# Patient Record
Sex: Male | Born: 1960 | Race: Black or African American | Hispanic: No | Marital: Married | Smoking: Never smoker
Health system: Southern US, Community
[De-identification: ages and names within clinical notes are randomized; demographics above are authoritative.]

## PROBLEM LIST (undated history)

## (undated) DIAGNOSIS — E119 Type 2 diabetes mellitus without complications: Secondary | ICD-10-CM

## (undated) DIAGNOSIS — R011 Cardiac murmur, unspecified: Secondary | ICD-10-CM

## (undated) DIAGNOSIS — I10 Essential (primary) hypertension: Secondary | ICD-10-CM

## (undated) DIAGNOSIS — N189 Chronic kidney disease, unspecified: Secondary | ICD-10-CM

## (undated) HISTORY — DX: Type 2 diabetes mellitus without complications: E11.9

## (undated) HISTORY — DX: Cardiac murmur, unspecified: R01.1

## (undated) HISTORY — DX: Essential (primary) hypertension: I10

## (undated) HISTORY — PX: NO PAST SURGERIES: SHX2092

## (undated) HISTORY — DX: Chronic kidney disease, unspecified: N18.9

---

## 2004-06-12 ENCOUNTER — Emergency Department (HOSPITAL_COMMUNITY): Admission: EM | Admit: 2004-06-12 | Discharge: 2004-06-12 | Payer: Self-pay | Admitting: Family Medicine

## 2006-03-01 IMAGING — CR DG HAND COMPLETE 3+V*R*
3 series · 3 of 3 positions shown · non-contrast
Comparison: none

CLINICAL DATA: multiple lacerations to right hand with glass
 RIGHT HAND-THREE VIEWS:
 There is no evidence of fracture or dislocation.  No other bone abnormality is seen.  There is no evidence of radiopaque foreign body.

[view not recorded (1 of 3)]
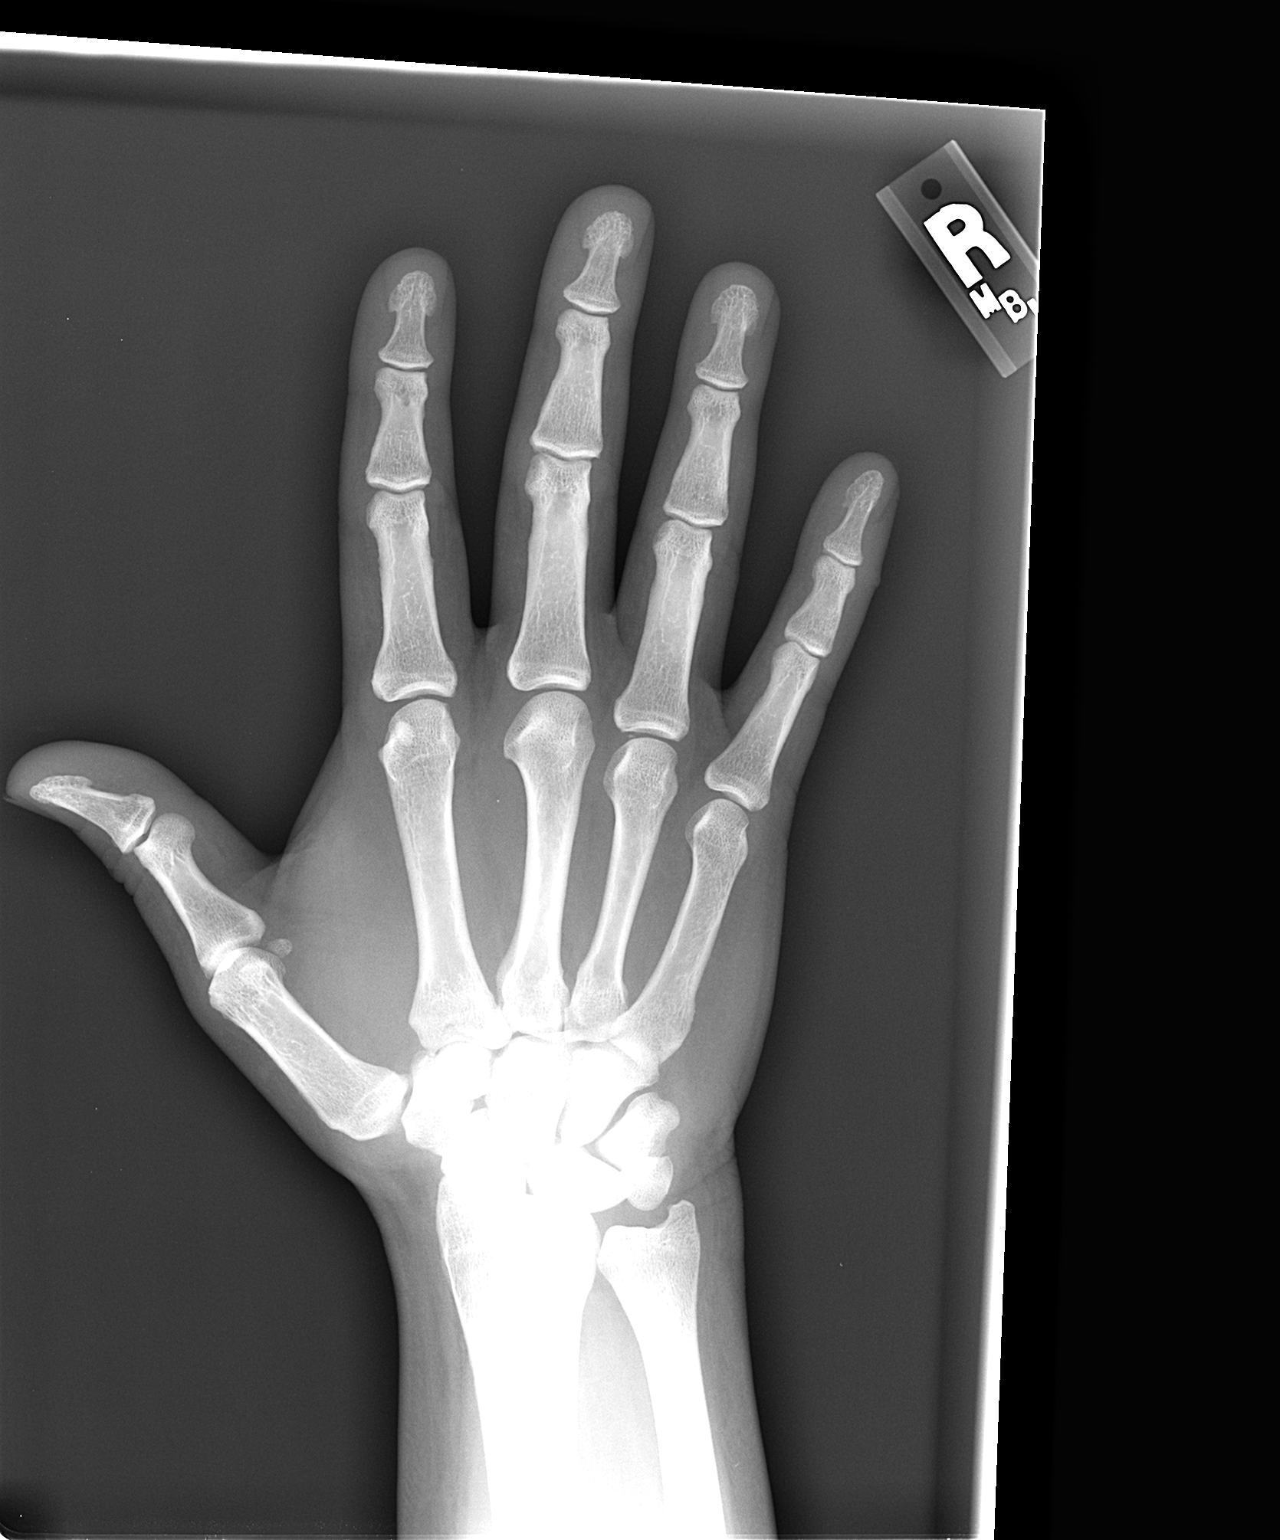

[view not recorded (2 of 3)]
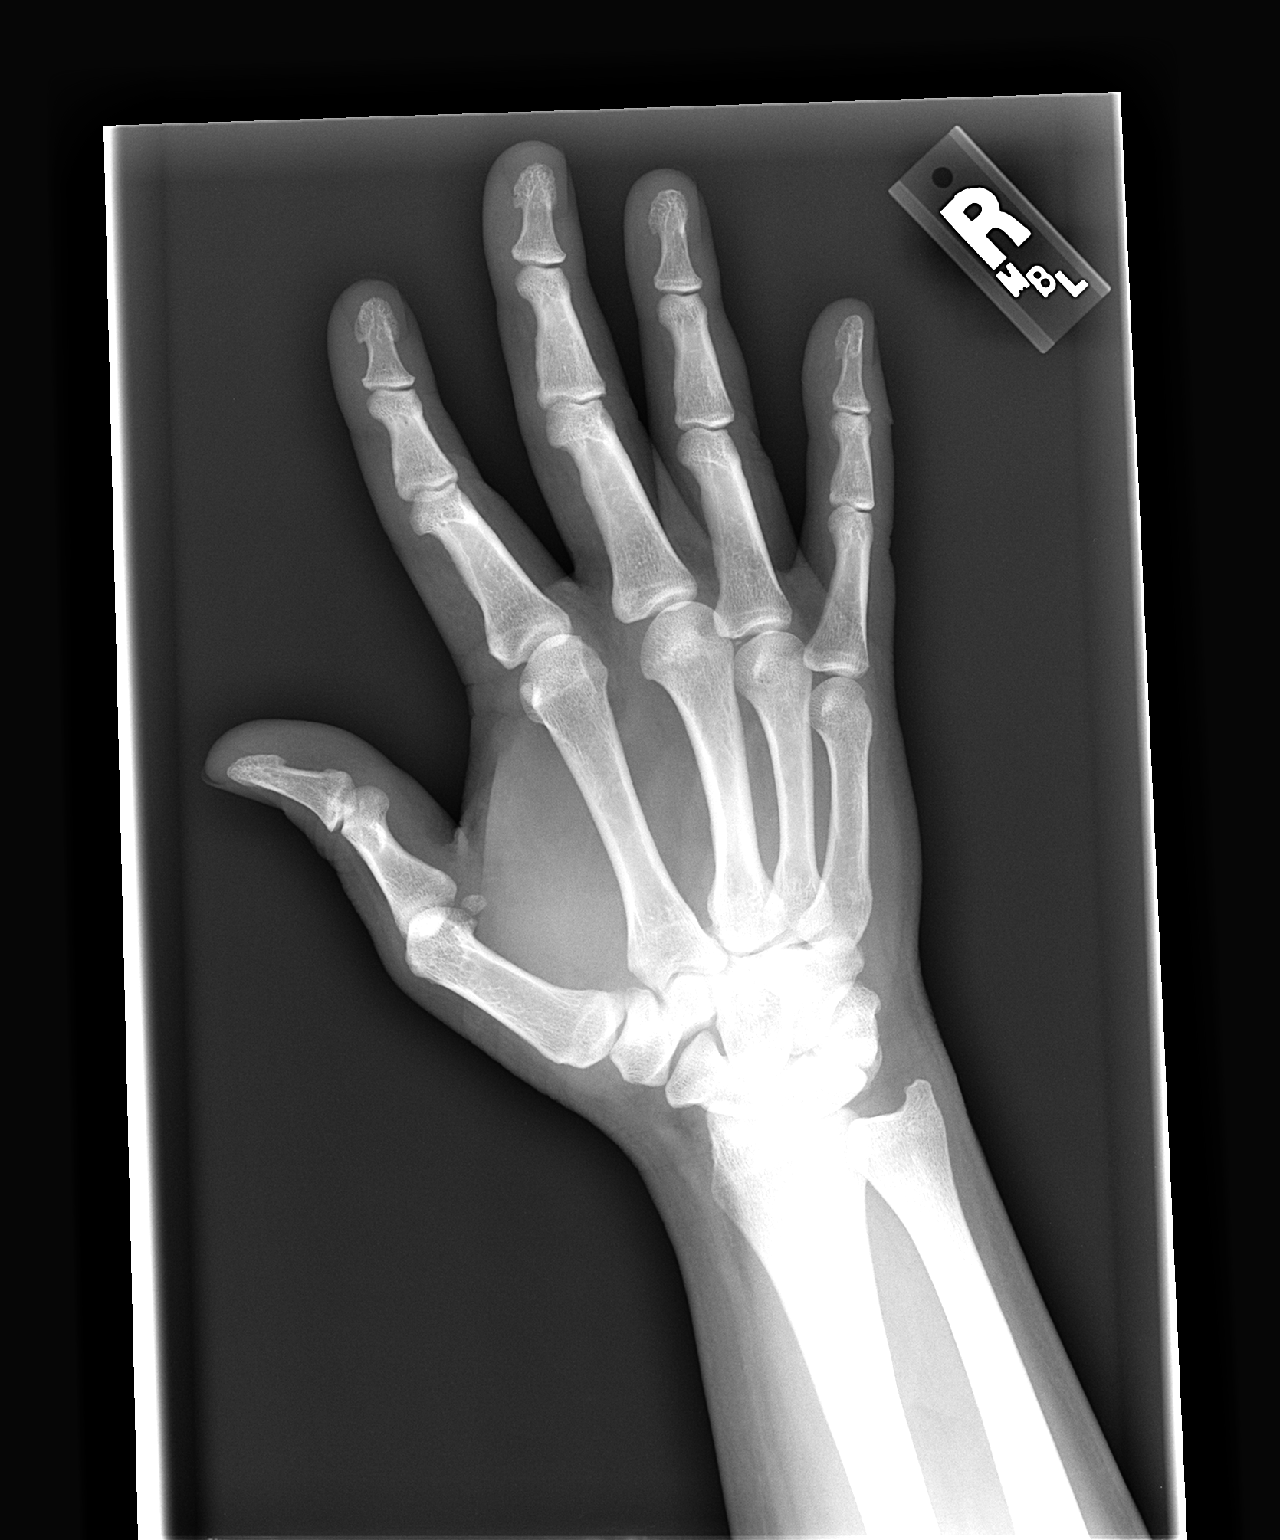

[view not recorded (3 of 3)]
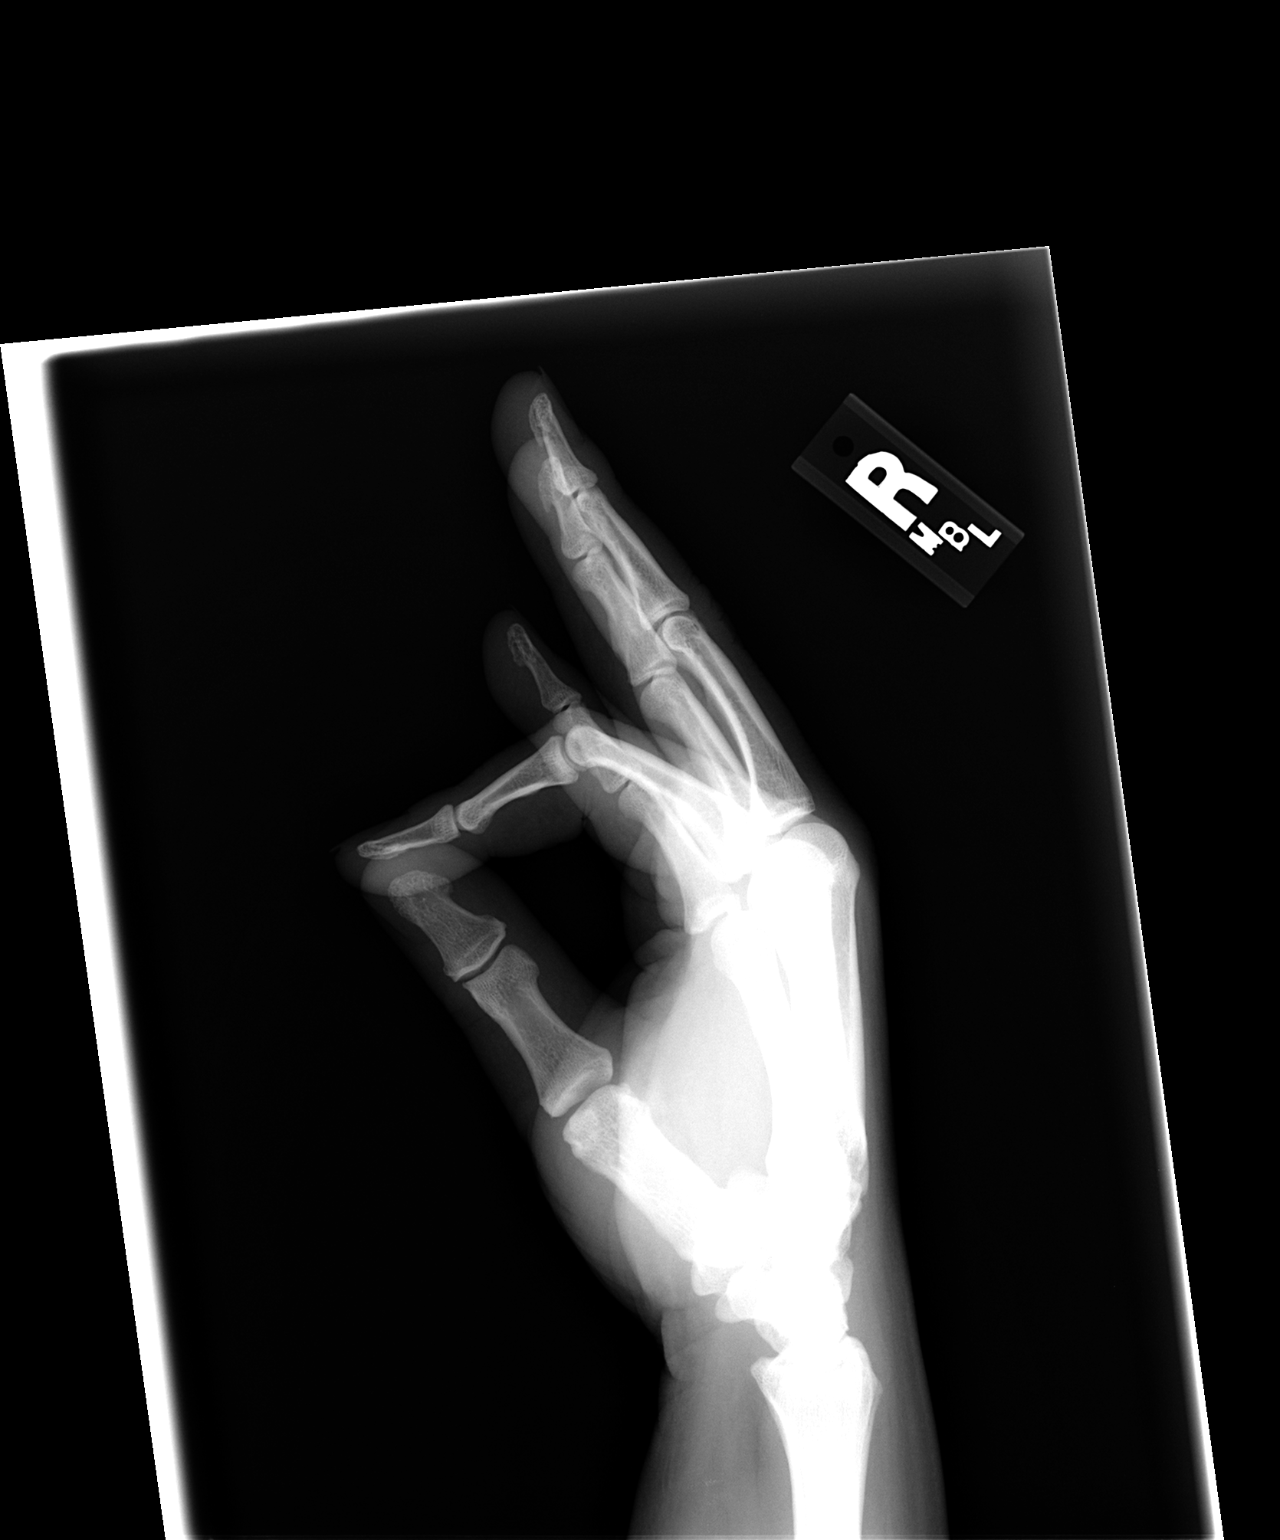

[3 of 3 positions shown; findings below may reference images not displayed]

IMPRESSION: Negative.

## 2014-12-03 ENCOUNTER — Ambulatory Visit (INDEPENDENT_AMBULATORY_CARE_PROVIDER_SITE_OTHER): Payer: BLUE CROSS/BLUE SHIELD | Admitting: Emergency Medicine

## 2014-12-03 VITALS — BP 118/70 | HR 89 | Temp 99.0°F | Resp 18 | Ht 70.0 in | Wt 180.0 lb

## 2014-12-03 DIAGNOSIS — H9203 Otalgia, bilateral: Secondary | ICD-10-CM

## 2014-12-03 DIAGNOSIS — R509 Fever, unspecified: Secondary | ICD-10-CM | POA: Diagnosis not present

## 2014-12-03 DIAGNOSIS — E119 Type 2 diabetes mellitus without complications: Secondary | ICD-10-CM | POA: Diagnosis not present

## 2014-12-03 LAB — POCT CBC
GRANULOCYTE PERCENT: 71.8 % (ref 37–80)
HCT, POC: 46.6 % (ref 43.5–53.7)
Hemoglobin: 15.7 g/dL (ref 14.1–18.1)
LYMPH, POC: 2.1 (ref 0.6–3.4)
MCH: 27.9 pg (ref 27–31.2)
MCHC: 33.7 g/dL (ref 31.8–35.4)
MCV: 82.9 fL (ref 80–97)
MID (CBC): 0.6 (ref 0–0.9)
MPV: 6.8 fL (ref 0–99.8)
PLATELET COUNT, POC: 280 10*3/uL (ref 142–424)
POC GRANULOCYTE: 7 — AB (ref 2–6.9)
POC LYMPH PERCENT: 21.5 %L (ref 10–50)
POC MID %: 6.7 % (ref 0–12)
RBC: 5.62 M/uL (ref 4.69–6.13)
RDW, POC: 13.4 %
WBC: 9.7 10*3/uL (ref 4.6–10.2)

## 2014-12-03 LAB — GLUCOSE, POCT (MANUAL RESULT ENTRY): POC Glucose: 113 mg/dl — AB (ref 70–99)

## 2014-12-03 LAB — POCT RAPID STREP A (OFFICE): Rapid Strep A Screen: NEGATIVE

## 2014-12-03 NOTE — Progress Notes (Addendum)
Subjective:    Patient ID: Danny Ferguson, male    DOB: November 30, 1960, 54 y.o.   MRN: [id number] This chart was scribed for Lesle Chris, MD by Littie Deeds, Medical Scribe. This patient was seen in room 9 and the patient's care was started at 11:36 AM.   HPI HPI Comments: Danny Ferguson is a 54 y.o. male with a history of DM who presents to the [facility] complaining of gradual onset sinus pressure that started a few days ago. Patient also reports having associated throbbing bilateral ear pain, mild cough, mild dizziness described as disorientation, and right sided facial pain. He also had a very small amount of nasal discharge in the shower this morning. He denies fever and problems with seasonal allergies.  Patient was diagnosed with diabetes about a year and a half ago. At that time, he weighed 249 lbs with a HgbA1c over 12. He also had some problems with urinary frequency at the time. Since then, he has been able to control his diabetes with metformin and diet changes. He notes that his last HgbA1c was 5.5. He has also lost a significant amount of weight; he is 180 lbs today. Patient had been noted to have some diminishing kidney function which has been improving slightly. This is being closely monitored. FMHx includes DM and hypertension in his father and grandfather.  Review of Systems  Constitutional: Negative for fever.  HENT: Positive for ear pain, rhinorrhea and sinus pressure. Negative for sore throat.   Respiratory: Positive for cough.   Allergic/Immunologic: Negative for environmental allergies.  Neurological: Positive for dizziness.       Objective:   Physical Exam CONSTITUTIONAL: Well developed/well nourished HEAD: Normocephalic/atraumatic EYES: EOM/PERRL ENMT: Mucous membranes moist. Turbinates blue and swollen NECK: supple no meningeal signs SPINE: entire spine nontender CV: S1/S2 noted, no murmurs/rubs/gallops noted LUNGS: Lungs are clear to  auscultation bilaterally, no apparent distress ABDOMEN: soft, nontender, no rebound or guarding GU: no cva tenderness NEURO: Pt is awake/alert, moves all extremitiesx4 EXTREMITIES: pulses normal, full ROM SKIN: warm, color normal PSYCH: no abnormalities of mood noted Results for orders placed or performed in visit on 12/03/14  POCT CBC  Result Value Ref Range   WBC 9.7 4.6 - 10.2 K/uL   Lymph, poc 2.1 0.6 - 3.4   POC LYMPH PERCENT 21.5 10 - 50 %L   MID (cbc) 0.6 0 - 0.9   POC MID % 6.7 0 - 12 %M   POC Granulocyte 7.0 (A) 2 - 6.9   Granulocyte percent 71.8 37 - 80 %G   RBC 5.62 4.69 - 6.13 M/uL   Hemoglobin 15.7 14.1 - 18.1 g/dL   HCT, POC 78.2 95.6 - 53.7 %   MCV 82.9 80 - 97 fL   MCH, POC 27.9 27 - 31.2 pg   MCHC 33.7 31.8 - 35.4 g/dL   RDW, POC 21.3 %   Platelet Count, POC 280 142 - 424 K/uL   MPV 6.8 0 - 99.8 fL  POCT glucose (manual entry)  Result Value Ref Range   POC Glucose 113 (A) 70 - 99 mg/dl  POCT rapid strep A  Result Value Ref Range   Rapid Strep A Screen Negative Negative        Assessment & Plan:   advise patient to take Zyrtec 10 one a day and use Flonase nasal spray. to call if he developed a purulent nasal drainage.I personally performed the services described in this documentation, which was scribed  in my presence. The recorded information has been reviewed and is accurate.  Earl LitesSteve Khaleel Beckom, MD

## 2014-12-03 NOTE — Patient Instructions (Signed)
Take Zyrtec 10 mg 1 a day. Use Flonase 2 puffs each nares once a day.

## 2014-12-15 ENCOUNTER — Telehealth: Payer: Self-pay

## 2014-12-15 NOTE — Telephone Encounter (Signed)
BMP was not done at [facility]. Per Dr. Cleta Alberts, call pt and just tell him it was not sent and advise him at some time he ought to have a good physical exam he can schedule at 104

## 2014-12-16 NOTE — Telephone Encounter (Signed)
Spoke to pt and informed him he should set up to have a good physical exam at 104.  Informed him the BMP was not sent out.

## 2015-07-20 LAB — PSA: PSA: 2.4

## 2016-07-18 LAB — HEMOGLOBIN A1C: HEMOGLOBIN A1C: 6.4

## 2016-07-18 LAB — BASIC METABOLIC PANEL
BUN: 25 — AB (ref 4–21)
Creatinine: 1.1 (ref 0.6–1.3)
Glucose: 126
Potassium: 4.5 (ref 3.4–5.3)
Sodium: 142 (ref 137–147)

## 2016-07-18 LAB — HEPATIC FUNCTION PANEL
ALK PHOS: 78 (ref 25–125)
ALT: 14 (ref 10–40)
AST: 20 (ref 14–40)
BILIRUBIN, TOTAL: 0.5

## 2016-07-18 LAB — CBC AND DIFFERENTIAL
HEMATOCRIT: 44 (ref 41–53)
Hemoglobin: 14.5 (ref 13.5–17.5)
PLATELETS: 414 — AB (ref 150–399)
WBC: 9.4

## 2016-07-19 LAB — PSA: PSA: 4.1

## 2017-04-25 ENCOUNTER — Encounter: Payer: Self-pay | Admitting: Physician Assistant

## 2017-04-25 ENCOUNTER — Other Ambulatory Visit: Payer: Self-pay

## 2017-04-25 ENCOUNTER — Ambulatory Visit: Payer: Managed Care, Other (non HMO) | Admitting: Physician Assistant

## 2017-04-25 VITALS — BP 138/88 | HR 90 | Temp 98.0°F | Resp 16 | Ht 70.87 in | Wt 197.0 lb

## 2017-04-25 DIAGNOSIS — Z125 Encounter for screening for malignant neoplasm of prostate: Secondary | ICD-10-CM | POA: Diagnosis not present

## 2017-04-25 DIAGNOSIS — M545 Low back pain, unspecified: Secondary | ICD-10-CM

## 2017-04-25 DIAGNOSIS — N189 Chronic kidney disease, unspecified: Secondary | ICD-10-CM | POA: Diagnosis not present

## 2017-04-25 DIAGNOSIS — I10 Essential (primary) hypertension: Secondary | ICD-10-CM | POA: Diagnosis not present

## 2017-04-25 DIAGNOSIS — Z23 Encounter for immunization: Secondary | ICD-10-CM

## 2017-04-25 DIAGNOSIS — I1A0 Resistant hypertension: Secondary | ICD-10-CM | POA: Insufficient documentation

## 2017-04-25 DIAGNOSIS — E119 Type 2 diabetes mellitus without complications: Secondary | ICD-10-CM | POA: Diagnosis not present

## 2017-04-25 LAB — POCT URINALYSIS DIP (MANUAL ENTRY)
BILIRUBIN UA: NEGATIVE
BILIRUBIN UA: NEGATIVE mg/dL
GLUCOSE UA: NEGATIVE mg/dL
Leukocytes, UA: NEGATIVE
Nitrite, UA: NEGATIVE
Protein Ur, POC: NEGATIVE mg/dL
SPEC GRAV UA: 1.015 (ref 1.010–1.025)
Urobilinogen, UA: 0.2 E.U./dL
pH, UA: 5.5 (ref 5.0–8.0)

## 2017-04-25 LAB — GLUCOSE, POCT (MANUAL RESULT ENTRY): POC Glucose: 149 mg/dl — AB (ref 70–99)

## 2017-04-25 MED ORDER — CYCLOBENZAPRINE HCL 10 MG PO TABS: 10.0000 mg | ORAL_TABLET | Freq: Three times a day (TID) | ORAL | 0 refills | Status: DC | PRN

## 2017-04-25 NOTE — Assessment & Plan Note (Signed)
Update labs today 

## 2017-04-25 NOTE — Assessment & Plan Note (Signed)
Last labs 12 months ago. Update today.

## 2017-04-25 NOTE — Assessment & Plan Note (Signed)
Borderline control today. May be elevated due to back pain. COntinue current treatment.

## 2017-04-25 NOTE — Progress Notes (Signed)
Patient ID: Danny FallenRobert Groner, male     DOB: 11/28/1960, 56 y.o.    MRN: [id number]  PCP: Patient, No Pcp Per, previous provider retired and practice has not found replacement.  Chief Complaint  Patient presents with  . Back Pain    lower back     Subjective:   This patient is new to me and presents for evaluation of low back pain.  1 week of LBP after getting items out of the attic. Had one episode of pain radiating into the LEFT groin. No pain radiating down the legs. No weakness, numbness, tingling in the legs. No saddle anesthesia. Has been on each side, mostly in the center. Difficult to sleep due to pain. Stiff when he gets up after sitting for a while or first thing in the morning. Avoids ibuprofen due to CKD. Acetaminophen 1000 mg Q8 hours, which helps some. Sitting still, "I'm ok."  Last labs done with previous provider about 12 months ago. Currently unemployed. Left is job due to undesirable work conditions. Plans to start a new job search after the first of the year. Has been helping is father, who lives in [place], and his son, recently diagnosed with bipolar disorder.  Review of Systems  Constitutional: Negative for chills and fever.  Respiratory: Negative for cough and shortness of breath.   Cardiovascular: Negative for chest pain, palpitations and leg swelling.  Gastrointestinal: Negative for diarrhea, nausea and vomiting.  Endocrine: Negative for polydipsia.  Genitourinary: Negative for dysuria, frequency and urgency.  Musculoskeletal: Negative for myalgias.  Skin: Negative for rash.  Neurological: Negative for dizziness and headaches.     Prior to Admission medications   Medication Sig Start Date End Date Taking? Authorizing Provider  lisinopril-hydrochlorothiazide (PRINZIDE,ZESTORETIC) 20-12.5 MG per tablet Take 1 tablet by mouth 2 (two) times daily.   Yes [provider]  metFORMIN (GLUCOPHAGE) 1000 MG tablet Take 1,000 mg by mouth 2  (two) times daily with a meal.   Yes [provider]     Allergies  Allergen Reactions  . Penicillins Nausea And Vomiting     Patient Active Problem List   Diagnosis Date Noted  . Diabetes mellitus type 2, uncomplicated (HCC) 04/25/2017  . Benign essential HTN 04/25/2017     Family History  Problem Relation Age of Onset  . Diabetes Mother   . Hypertension Father   . Diabetes Maternal Grandfather   . Cancer Maternal Grandfather   . Hyperlipidemia Maternal Grandfather   . Hypertension Maternal Grandfather   . Mental illness Son [id number]       Bipolar  . Cancer Sister        breast cancer     Social History   Socioeconomic History  . Marital status: Married    Spouse name: Anitra  . Number of children: 1  . Years of education: Not on file  . Highest education level: Master's degree (e.g., MA, MS, MEng, MEd, MSW, MBA)  Social Needs  . Financial resource strain: Not on file  . Food insecurity - worry: Not on file  . Food insecurity - inability: Not on file  . Transportation needs - medical: Not on file  . Transportation needs - non-medical: Not on file  Occupational History  . Occupation: unemployed  Tobacco Use  . Smoking status: Never Smoker  . Smokeless tobacco: Never Used  Substance and Sexual Activity  . Alcohol use: No    Alcohol/week: 0.0 oz  . Drug use: No  .  Sexual activity: Not on file  Other Topics Concern  . Not on file  Social History Narrative   Lives with his wife, his son who was diagnosed with bipolar disorder.   His father lives in [place].   Sisters: [place], [employer], [place], [place]         Objective:  Physical Exam  Constitutional: He is oriented to person, place, and time. He appears well-developed and well-nourished. He is active and cooperative. No distress.  BP 138/88   Pulse 90   Temp 98 F (36.7 C) (Oral)   Resp 16   Ht 5' 10.87" (1.8 m)   Wt 197 lb (89.4 kg)   SpO2 97%   BMI 27.58 kg/m   HENT:  Head: Normocephalic and  atraumatic.  Right Ear: Hearing normal.  Left Ear: Hearing normal.  Eyes: Conjunctivae are normal. No scleral icterus.  Neck: Normal range of motion. Neck supple. No thyromegaly present.  Cardiovascular: Normal rate, regular rhythm and normal heart sounds.  Pulses:      Radial pulses are 2+ on the right side, and 2+ on the left side.  Pulmonary/Chest: Effort normal and breath sounds normal.  Musculoskeletal:       Cervical back: Normal.       Thoracic back: Normal.       Lumbar back: He exhibits tenderness, pain and spasm. He exhibits normal range of motion, no bony tenderness, no swelling, no edema, no deformity, no laceration and normal pulse.  Lymphadenopathy:       Head (right side): No tonsillar, no preauricular, no posterior auricular and no occipital adenopathy present.       Head (left side): No tonsillar, no preauricular, no posterior auricular and no occipital adenopathy present.    He has no cervical adenopathy.       Right: No supraclavicular adenopathy present.       Left: No supraclavicular adenopathy present.  Neurological: He is alert and oriented to person, place, and time. He has normal strength. No sensory deficit.  Reflex Scores:      Patellar reflexes are 2+ on the right side and 2+ on the left side.      Achilles reflexes are 2+ on the right side and 2+ on the left side. Skin: Skin is warm, dry and intact. No rash noted. No cyanosis or erythema. Nails show no clubbing.  Psychiatric: He has a normal mood and affect. His speech is normal and behavior is normal.   Results for orders placed or performed in visit on 04/25/17  POCT urinalysis dipstick  Result Value Ref Range   Color, UA yellow yellow   Clarity, UA clear clear   Glucose, UA negative negative mg/dL   Bilirubin, UA negative negative   Ketones, POC UA negative negative mg/dL   Spec Grav, UA 1.610 9.604 - 1.025   Blood, UA trace-lysed (A) negative   pH, UA 5.5 5.0 - 8.0   Protein Ur, POC negative  negative mg/dL   Urobilinogen, UA 0.2 0.2 or 1.0 E.U./dL   Nitrite, UA Negative Negative   Leukocytes, UA Negative Negative  POCT glucose (manual entry)  Result Value Ref Range   POC Glucose 149 (A) 70 - 99 mg/dl        Assessment & Plan:   Problem List Items Addressed This Visit    Diabetes mellitus type 2, uncomplicated (HCC)    Last labs 12 months ago. Update today.      Relevant Orders   Comprehensive metabolic panel  Hemoglobin A1c   Lipid panel   Microalbumin / creatinine urine ratio   POCT glucose (manual entry) (Completed)   Benign essential HTN    Borderline control today. May be elevated due to back pain. COntinue current treatment.      Relevant Orders   POCT urinalysis dipstick (Completed)   CBC with Differential/Platelet   Comprehensive metabolic panel   TSH   CKD (chronic kidney disease)    Update labs today.      Relevant Orders   Comprehensive metabolic panel    Other Visit Diagnoses    Acute midline low back pain without sciatica    -  Primary   Musculoskeletal strain. COntinue acetaminophen. Add heat application and cyclobenzaprine. If no improvement, plan radiographs.   Relevant Medications   cyclobenzaprine (FLEXERIL) 10 MG tablet   Need for influenza vaccination       Relevant Orders   Flu Vaccine QUAD 36+ mos IM (Completed)   Screening for prostate cancer       reports levels consistently about 4. Has need urology previously.   Relevant Orders   PSA       Return in about 3 months (around 07/24/2017) for re-evaluation of diabetes, etc.   Fernande Brashelle S. Sheamus Hasting, PA-C [facility]

## 2017-04-25 NOTE — Patient Instructions (Signed)
     IF you received an x-ray today, you will receive an invoice from [facility]. Please contact [facility] at [phone] with questions or concerns regarding your invoice.   IF you received labwork today, you will receive an invoice from [facility]. Please contact [facility] at [phone] with questions or concerns regarding your invoice.   Our billing staff will not be able to assist you with questions regarding bills from these companies.  You will be contacted with the lab results as soon as they are available. The fastest way to get your results is to activate your My Chart account. Instructions are located on the last page of this paperwork. If you have not heard from us regarding the results in 2 weeks, please contact this office.     

## 2017-04-26 LAB — COMPREHENSIVE METABOLIC PANEL
ALT: 28 IU/L (ref 0–44)
AST: 19 IU/L (ref 0–40)
Albumin/Globulin Ratio: 1.8 (ref 1.2–2.2)
Albumin: 4.6 g/dL (ref 3.5–5.5)
Alkaline Phosphatase: 80 IU/L (ref 39–117)
BUN/Creatinine Ratio: 15 (ref 9–20)
BUN: 19 mg/dL (ref 6–24)
Bilirubin Total: 0.9 mg/dL (ref 0.0–1.2)
CALCIUM: 9.7 mg/dL (ref 8.7–10.2)
CO2: 24 mmol/L (ref 20–29)
CREATININE: 1.23 mg/dL (ref 0.76–1.27)
Chloride: 98 mmol/L (ref 96–106)
GFR calc Af Amer: 75 mL/min/{1.73_m2} (ref >59)
GFR, EST NON AFRICAN AMERICAN: 65 mL/min/{1.73_m2} (ref >59)
GLUCOSE: 126 mg/dL — AB (ref 65–99)
Globulin, Total: 2.5 g/dL (ref 1.5–4.5)
Potassium: 4.1 mmol/L (ref 3.5–5.2)
Sodium: 139 mmol/L (ref 134–144)
Total Protein: 7.1 g/dL (ref 6.0–8.5)

## 2017-04-26 LAB — CBC WITH DIFFERENTIAL/PLATELET
BASOS: 0 %
Basophils Absolute: 0 10*3/uL (ref 0.0–0.2)
EOS (ABSOLUTE): 0.1 10*3/uL (ref 0.0–0.4)
EOS: 1 %
HEMATOCRIT: 45 % (ref 37.5–51.0)
Hemoglobin: 15 g/dL (ref 13.0–17.7)
IMMATURE GRANULOCYTES: 0 %
Immature Grans (Abs): 0 10*3/uL (ref 0.0–0.1)
LYMPHS ABS: 1.8 10*3/uL (ref 0.7–3.1)
Lymphs: 22 %
MCH: 28.9 pg (ref 26.6–33.0)
MCHC: 33.3 g/dL (ref 31.5–35.7)
MCV: 87 fL (ref 79–97)
MONOS ABS: 0.7 10*3/uL (ref 0.1–0.9)
Monocytes: 8 %
NEUTROS ABS: 5.8 10*3/uL (ref 1.4–7.0)
Neutrophils: 69 %
PLATELETS: 312 10*3/uL (ref 150–379)
RBC: 5.19 x10E6/uL (ref 4.14–5.80)
RDW: 14.1 % (ref 12.3–15.4)
WBC: 8.4 10*3/uL (ref 3.4–10.8)

## 2017-04-26 LAB — LIPID PANEL
CHOLESTEROL TOTAL: 167 mg/dL (ref 100–199)
Chol/HDL Ratio: 2.9 ratio (ref 0.0–5.0)
HDL: 57 mg/dL (ref >39)
LDL Calculated: 98 mg/dL (ref 0–99)
Triglycerides: 61 mg/dL (ref 0–149)
VLDL CHOLESTEROL CAL: 12 mg/dL (ref 5–40)

## 2017-04-26 LAB — HEMOGLOBIN A1C
ESTIMATED AVERAGE GLUCOSE: 148 mg/dL
Hgb A1c MFr Bld: 6.8 % — ABNORMAL HIGH (ref 4.8–5.6)

## 2017-04-26 LAB — MICROALBUMIN / CREATININE URINE RATIO
Creatinine, Urine: 105 mg/dL
Microalbumin, Urine: <3 ug/mL

## 2017-04-26 LAB — TSH: TSH: 1.59 u[IU]/mL (ref 0.450–4.500)

## 2017-04-26 LAB — PSA: PROSTATE SPECIFIC AG, SERUM: 4.4 ng/mL — AB (ref 0.0–4.0)

## 2017-05-15 ENCOUNTER — Encounter: Payer: Self-pay | Admitting: Physician Assistant

## 2017-05-15 DIAGNOSIS — R972 Elevated prostate specific antigen [PSA]: Secondary | ICD-10-CM | POA: Insufficient documentation

## 2017-05-31 ENCOUNTER — Ambulatory Visit: Payer: Managed Care, Other (non HMO) | Admitting: Family Medicine

## 2017-05-31 ENCOUNTER — Encounter: Payer: Self-pay | Admitting: Family Medicine

## 2017-05-31 VITALS — BP 138/86 | HR 81 | Temp 98.5°F | Ht 69.5 in | Wt 186.2 lb

## 2017-05-31 DIAGNOSIS — N189 Chronic kidney disease, unspecified: Secondary | ICD-10-CM | POA: Diagnosis not present

## 2017-05-31 DIAGNOSIS — R972 Elevated prostate specific antigen [PSA]: Secondary | ICD-10-CM

## 2017-05-31 DIAGNOSIS — Z23 Encounter for immunization: Secondary | ICD-10-CM

## 2017-05-31 DIAGNOSIS — E119 Type 2 diabetes mellitus without complications: Secondary | ICD-10-CM | POA: Diagnosis not present

## 2017-05-31 DIAGNOSIS — M545 Low back pain, unspecified: Secondary | ICD-10-CM | POA: Insufficient documentation

## 2017-05-31 DIAGNOSIS — I10 Essential (primary) hypertension: Secondary | ICD-10-CM | POA: Diagnosis not present

## 2017-05-31 NOTE — Patient Instructions (Signed)
Great to meet you.  Please come see me in 3 months.

## 2017-05-31 NOTE — Assessment & Plan Note (Signed)
Well controlled. On ACEI. LDL at goal for a diabetic. On ASA 81 mg daily. Pneumovax given today.

## 2017-05-31 NOTE — Assessment & Plan Note (Signed)
Well controlled.  No changes made. 

## 2017-05-31 NOTE — Assessment & Plan Note (Signed)
Lumbar strain. Improving No red flag symptoms. Call or return to clinic prn if these symptoms worsen or fail to improve as anticipated. The patient indicates understanding of these issues and agrees with the plan.

## 2017-05-31 NOTE — Assessment & Plan Note (Signed)
Awaiting records to see PSA trend given family history of prostate CA. He does not want referral back to see urology yet at this time.

## 2017-05-31 NOTE — Progress Notes (Signed)
Subjective:   Patient ID: Danny Ferguson, male    DOB: 1960/08/21, 57 y.o.   MRN: [id number]  Danny Ferguson is a pleasant 57 y.o. year old male who presents to clinic today with New Patient (Initial Visit) (Patient is here today as a new patient.  Labs completed in December have been printed for review.  ); Diabetes (Would like to discuss DM. Was almost 300 lbs when Dx and went to kidney doc.  He lost a lot of weight and has improved.  ); Back Pain (Patient went to an UC for lower back pain on 12.04.2018.  The pain started around Thanksgiving.  He was unable to get in to his previous office as doc was only there once weekly in [place].  He was Rx'ed Cyclobenzaprine but states that he is still having issues with it but it has decreased and has a "catch" in it.); and Other (He would like to discuss his PSA numbers as well.)  on 05/31/2017  HPI:  Was seen at [facility] for LBP on 04/25/17.  Took as needed flexeril.  It does feel better today.  Denies LE radiculopathy.  DM- Diagnosed in 2014. currently taking Metformin 1000 mg twice daily.  Labs done last month at previous PCP. Checks FSBS every other week.  Denies any episodes or symptoms of hypoglycemia. Lab Results  Component Value Date   HGBA1C 6.8 (H) 04/25/2017   Family h/o prostate CA- dad and grandfather. PSA 4.4 last month.  Was previously followed by urology but has not been seen recently.  He is on an ACEI- pinzide 20-12.5 mg twice daily.  Lab Results  Component Value Date   CREATININE 1.23 04/25/2017   Lab Results  Component Value Date   CHOL 167 04/25/2017   HDL 57 04/25/2017   LDLCALC 98 04/25/2017   TRIG 61 04/25/2017   CHOLHDL 2.9 04/25/2017   Lab Results  Component Value Date   ALT 28 04/25/2017   AST 19 04/25/2017   ALKPHOS 80 04/25/2017   BILITOT 0.9 04/25/2017   Lab Results  Component Value Date   NA 139 04/25/2017   K 4.1 04/25/2017   CL 98 04/25/2017   CO2 24 04/25/2017   No results found for: PSA    Current  Outpatient Medications on File Prior to Visit  Medication Sig Dispense Refill  . aspirin EC 81 MG tablet Take 81 mg by mouth daily.    . cyclobenzaprine (FLEXERIL) 10 MG tablet Take 1 tablet (10 mg total) by mouth 3 (three) times daily as needed for muscle spasms. 30 tablet 0  . lisinopril-hydrochlorothiazide (PRINZIDE,ZESTORETIC) 20-12.5 MG per tablet Take 1 tablet by mouth 2 (two) times daily.    . metFORMIN (GLUCOPHAGE) 1000 MG tablet Take 1,000 mg by mouth 2 (two) times daily with a meal.     No current facility-administered medications on file prior to visit.     Allergies  Allergen Reactions  . Penicillins Nausea And Vomiting    Past Medical History:  Diagnosis Date  . Chronic kidney disease   . Diabetes mellitus without complication (HCC)   . Heart murmur   . Hypertension     Past Surgical History:  Procedure Laterality Date  . NO PAST SURGERIES      Family History  Problem Relation Age of Onset  . Diabetes Mother   . Hypertension Mother   . Hypertension Father   . Cancer Father        Prostate  . Hyperlipidemia Father   .  Cancer Maternal Grandmother        Breast  . Diabetes Maternal Grandmother   . Hypertension Maternal Grandmother   . Factor V Leiden deficiency Maternal Grandmother   . Alzheimer's disease Maternal Grandfather   . Mental illness Son 51       Bipolar  . Depression Son   . Cancer Sister        breast cancer  . Alzheimer's disease Paternal Grandmother   . Hypertension Paternal Grandmother   . Cancer Paternal Grandfather        Bone & Prostate  . Diabetes Paternal Grandfather   . Hyperlipidemia Paternal Grandfather   . Hypertension Paternal Grandfather   . Glaucoma Paternal Grandfather     Social History   Socioeconomic History  . Marital status: Married    Spouse name: Anitra  . Number of children: 1  . Years of education: Not on file  . Highest education level: Master's degree (e.g., MA, MS, MEng, MEd, MSW, MBA)  Social Needs    . Financial resource strain: Not on file  . Food insecurity - worry: Not on file  . Food insecurity - inability: Not on file  . Transportation needs - medical: Not on file  . Transportation needs - non-medical: Not on file  Occupational History  . Occupation: unemployed  Tobacco Use  . Smoking status: Never Smoker  . Smokeless tobacco: Never Used  Substance and Sexual Activity  . Alcohol use: No    Alcohol/week: 0.0 oz  . Drug use: No  . Sexual activity: Not on file  Other Topics Concern  . Not on file  Social History Narrative   Lives with his wife, his son who was diagnosed with bipolar disorder.   His father lives in [place].   Sisters: [place], [place], [place], [place]   The PMH, PSH, Social History, Family History, Medications, and allergies have been reviewed in [facility], and have been updated if relevant.   Review of Systems  Constitutional: Negative.   HENT: Negative.   Eyes: Negative.   Respiratory: Negative.   Cardiovascular: Negative.   Gastrointestinal: Negative.   Endocrine: Negative.   Genitourinary: Negative.   Musculoskeletal: Positive for back pain.  Allergic/Immunologic: Negative.   Neurological: Negative.   Hematological: Negative.   Psychiatric/Behavioral: Negative.   All other systems reviewed and are negative.      Objective:    BP 138/86 (BP Location: Left Arm, Patient Position: Sitting, Cuff Size: Normal)   Pulse 81   Temp 98.5 F (36.9 C) (Oral)   Ht 5' 9.5" (1.765 m)   Wt 186 lb 3.2 oz (84.5 kg)   SpO2 97%   BMI 27.10 kg/m    Physical Exam  General:  pleasant male in no acute distress Eyes:  PERRL Ears:  External ear exam shows no significant lesions or deformities.  TMs normal bilaterally Hearing is grossly normal bilaterally. Nose:  External nasal examination shows no deformity or inflammation. Nasal mucosa are pink and moist without lesions or exudates. Mouth:  Oral mucosa and oropharynx without lesions or exudates.  Teeth in good  repair. Neck:  no carotid bruit or thyromegaly no cervical or supraclavicular lymphadenopathy  Lungs:  Normal respiratory effort, chest expands symmetrically. Lungs are clear to auscultation, no crackles or wheezes. Heart:  Normal rate and regular rhythm. S1 and S2 normal without gallop, murmur, click, rub or other extra sounds. Abdomen:  Bowel sounds positive,abdomen soft and non-tender without masses, organomegaly or hernias noted. Pulses:  R and L  posterior tibial pulses are full and equal bilaterally  Extremities:  no edema  Psych:  Good eye contact, not anxious or depressed appearing      Assessment & Plan:   Benign essential HTN  Chronic kidney disease, unspecified CKD stage  Type 2 diabetes mellitus without complication, without long-term current use of insulin (HCC)  Elevated PSA, less than 10 ng/ml No Follow-up on file.

## 2017-07-25 ENCOUNTER — Ambulatory Visit: Payer: Managed Care, Other (non HMO) | Admitting: Physician Assistant

## 2017-08-29 ENCOUNTER — Telehealth: Payer: Self-pay

## 2017-08-29 ENCOUNTER — Ambulatory Visit: Payer: Managed Care, Other (non HMO) | Admitting: Family Medicine

## 2017-08-29 ENCOUNTER — Encounter: Payer: Self-pay | Admitting: Family Medicine

## 2017-08-29 VITALS — BP 144/96 | HR 68 | Temp 98.7°F | Ht 70.0 in | Wt 196.0 lb

## 2017-08-29 DIAGNOSIS — M545 Low back pain, unspecified: Secondary | ICD-10-CM

## 2017-08-29 DIAGNOSIS — E119 Type 2 diabetes mellitus without complications: Secondary | ICD-10-CM | POA: Diagnosis not present

## 2017-08-29 DIAGNOSIS — N189 Chronic kidney disease, unspecified: Secondary | ICD-10-CM | POA: Diagnosis not present

## 2017-08-29 DIAGNOSIS — I10 Essential (primary) hypertension: Secondary | ICD-10-CM

## 2017-08-29 DIAGNOSIS — R972 Elevated prostate specific antigen [PSA]: Secondary | ICD-10-CM | POA: Diagnosis not present

## 2017-08-29 LAB — POCT GLYCOSYLATED HEMOGLOBIN (HGB A1C): HEMOGLOBIN A1C: 6.8

## 2017-08-29 MED ORDER — CYCLOBENZAPRINE HCL 10 MG PO TABS: 10.0000 mg | ORAL_TABLET | Freq: Three times a day (TID) | ORAL | 0 refills | Status: DC | PRN

## 2017-08-29 MED ORDER — LISINOPRIL-HYDROCHLOROTHIAZIDE 20-12.5 MG PO TABS: 1.0000 | ORAL_TABLET | Freq: Two times a day (BID) | ORAL | 1 refills | Status: DC

## 2017-08-29 MED ORDER — METFORMIN HCL 1000 MG PO TABS: 1000.0000 mg | ORAL_TABLET | Freq: Two times a day (BID) | ORAL | 1 refills | Status: DC

## 2017-08-29 NOTE — Patient Instructions (Signed)
Great to see you. Please come see me in 6 months.   

## 2017-08-29 NOTE — Assessment & Plan Note (Signed)
Just took his rxs before coming here. Has been at goal for a diabetic. No changes made.

## 2017-08-29 NOTE — Telephone Encounter (Signed)
In January we faxed a signed records request/we have yet to receive the records/I called and was transferred to a nurse's line/I LMOVM stating that we have an urgent need for past PSA levels to be faxed to our office ASAP as the pt is in the office and this was the point of the visit today/I also refaxed the records request with a coversheet explaining the urgency/thx dmf

## 2017-08-29 NOTE — Progress Notes (Signed)
Subjective:   Patient ID: Danny Ferguson, male    DOB: August 06, 1960, 57 y.o.   MRN: [id number]  Danny Ferguson is a pleasant 57 y.o. year old male who presents to clinic today with Follow-up (Patient is here today to F/U with DM, HTN, and PSA.  on 12.4.18 A1C was 6.8 he has been compliant with his medications.  He has an eye exam scheduled for this month.  On 12.4.18 his PSA was 4.4 and he did not wish to have referral at that time.)  on 08/29/2017  HPI:  Here for 3 month follow up.  Established care with me on 05/31/17- note reviewed.  DM- Diagnosed in 2014. currently taking Metformin 1000 mg twice daily.  Labs done last month at previous PCP. Checks FSBS every other week.  Denies any episodes or symptoms of hypoglycemia.  Lab Results  Component Value Date   HGBA1C 6.8 (H) 04/25/2017  Pneumovax given at last OV.  Lab Results  Component Value Date   LDLCALC 98 04/25/2017     Family h/o prostate CA- dad and grandfather. PSA 4.4 last month.  Was previously followed by urology but has not been seen recently and referred referral at last OV. He feels his urinary stream has not changed at all in years.  No results found for: PSA   He is on an ACEI- pinzide 20-12.5 mg twice daily.  Current Outpatient Medications on File Prior to Visit  Medication Sig Dispense Refill  . aspirin EC 81 MG tablet Take 81 mg by mouth daily.     No current facility-administered medications on file prior to visit.     Allergies  Allergen Reactions  . Penicillins Nausea And Vomiting    Past Medical History:  Diagnosis Date  . Chronic kidney disease   . Diabetes mellitus without complication (HCC)   . Heart murmur   . Hypertension     Past Surgical History:  Procedure Laterality Date  . NO PAST SURGERIES      Family History  Problem Relation Age of Onset  . Diabetes Mother   . Hypertension Mother   . Hypertension Father   . Cancer Father        Prostate  . Hyperlipidemia Father   .  Cancer Maternal Grandmother        Breast  . Diabetes Maternal Grandmother   . Hypertension Maternal Grandmother   . Factor V Leiden deficiency Maternal Grandmother   . Alzheimer's disease Maternal Grandfather   . Mental illness Son 63       Bipolar  . Depression Son   . Cancer Sister        breast cancer  . Alzheimer's disease Paternal Grandmother   . Hypertension Paternal Grandmother   . Cancer Paternal Grandfather        Bone & Prostate  . Diabetes Paternal Grandfather   . Hyperlipidemia Paternal Grandfather   . Hypertension Paternal Grandfather   . Glaucoma Paternal Grandfather     Social History   Socioeconomic History  . Marital status: Married    Spouse name: Danny Ferguson  . Number of children: 1  . Years of education: Not on file  . Highest education level: Master's degree (e.g., MA, MS, MEng, MEd, MSW, MBA)  Occupational History  . Occupation: unemployed  Social Needs  . Financial resource strain: Not on file  . Food insecurity:    Worry: Not on file    Inability: Not on file  . Transportation needs:  Medical: Not on file    Non-medical: Not on file  Tobacco Use  . Smoking status: Never Smoker  . Smokeless tobacco: Never Used  Substance and Sexual Activity  . Alcohol use: No    Alcohol/week: 0.0 oz  . Drug use: No  . Sexual activity: Not on file  Lifestyle  . Physical activity:    Days per week: Not on file    Minutes per session: Not on file  . Stress: Not on file  Relationships  . Social connections:    Talks on phone: Not on file    Gets together: Not on file    Attends religious service: Not on file    Active member of club or organization: Not on file    Attends meetings of clubs or organizations: Not on file    Relationship status: Not on file  . Intimate partner violence:    Fear of current or ex partner: Not on file    Emotionally abused: Not on file    Physically abused: Not on file    Forced sexual activity: Not on file  Other Topics  Concern  . Not on file  Social History Narrative   Lives with his wife, his son who was diagnosed with bipolar disorder.   His father lives in [place].   Sisters: [place], [place], [place], [place]   The PMH, PSH, Social History, Family History, Medications, and allergies [facility], and have been updated if relevant.   Review of Systems  Constitutional: Negative.   HENT: Negative.   Eyes: Negative.   Respiratory: Negative.   Cardiovascular: Negative.   Genitourinary: Negative.   Musculoskeletal: Negative.   Hematological: Negative.   Psychiatric/Behavioral: Negative.   All other systems reviewed and are negative.      Objective:    BP (!) 144/96 (BP Location: Left Arm, Patient Position: Sitting, Cuff Size: Normal)   Pulse 68   Temp 98.7 F (37.1 C) (Oral)   Ht 5\' 10"  (1.778 m)   Wt 196 lb (88.9 kg)   SpO2 98%   BMI 28.12 kg/m   Wt Readings from Last 3 Encounters:  08/29/17 196 lb (88.9 kg)  05/31/17 186 lb 3.2 oz (84.5 kg)  04/25/17 197 lb (89.4 kg)     Physical Exam   General:  pleasant male in no acute distress Eyes:  PERRL Ears:  External ear exam shows no significant lesions or deformities.  TMs normal bilaterally Hearing is grossly normal bilaterally. Nose:  External nasal examination shows no deformity or inflammation. Nasal mucosa are pink and moist without lesions or exudates. Mouth:  Oral mucosa and oropharynx without lesions or exudates.  Teeth in good repair. Neck:  no carotid bruit or thyromegaly no cervical or supraclavicular lymphadenopathy  Lungs:  Normal respiratory effort, chest expands symmetrically. Lungs are clear to auscultation, no crackles or wheezes. Heart:  Normal rate and regular rhythm. S1 and S2 normal without gallop, murmur, click, rub or other extra sounds. Abdomen:  Bowel sounds positive,abdomen soft and non-tender without masses, organomegaly or hernias noted. Pulses:  R and L posterior tibial pulses are full and equal  bilaterally  Extremities:  no edema  Psych:  Good eye contact, not anxious or depressed appearing      Assessment & Plan:   Elevated PSA, less than 10 ng/ml  Type 2 diabetes mellitus without complication, without long-term current use of insulin (HCC) - Plan: POCT HgB A1C  Chronic kidney disease, unspecified CKD stage  Benign essential HTN  Acute midline low back pain without sciatica - Musculoskeletal strain. COntinue acetaminophen. Add heat application and cyclobenzaprine. If no improvement, plan radiographs. - Plan: cyclobenzaprine (FLEXERIL) 10 MG tablet No follow-ups on file.

## 2017-08-29 NOTE — Assessment & Plan Note (Signed)
Stable. a1c 6.8 today. No changes made.

## 2017-08-29 NOTE — Assessment & Plan Note (Signed)
Called his previous PCP to get records again today.

## 2017-09-07 ENCOUNTER — Encounter: Payer: Self-pay | Admitting: Family Medicine

## 2017-09-07 NOTE — Progress Notes (Signed)
[facility]/thx dmf 

## 2017-09-07 NOTE — Progress Notes (Signed)
[facility]/thx dmf

## 2018-02-28 ENCOUNTER — Ambulatory Visit: Payer: Managed Care, Other (non HMO) | Admitting: Family Medicine

## 2018-02-28 ENCOUNTER — Encounter: Payer: Self-pay | Admitting: Family Medicine

## 2018-02-28 VITALS — BP 112/82 | HR 69 | Temp 98.5°F | Ht 70.0 in | Wt 190.4 lb

## 2018-02-28 DIAGNOSIS — I10 Essential (primary) hypertension: Secondary | ICD-10-CM | POA: Diagnosis not present

## 2018-02-28 DIAGNOSIS — N183 Chronic kidney disease, stage 3 unspecified: Secondary | ICD-10-CM

## 2018-02-28 DIAGNOSIS — R972 Elevated prostate specific antigen [PSA]: Secondary | ICD-10-CM

## 2018-02-28 DIAGNOSIS — Z1159 Encounter for screening for other viral diseases: Secondary | ICD-10-CM

## 2018-02-28 DIAGNOSIS — Z23 Encounter for immunization: Secondary | ICD-10-CM

## 2018-02-28 DIAGNOSIS — E119 Type 2 diabetes mellitus without complications: Secondary | ICD-10-CM | POA: Diagnosis not present

## 2018-02-28 DIAGNOSIS — S59902A Unspecified injury of left elbow, initial encounter: Secondary | ICD-10-CM

## 2018-02-28 DIAGNOSIS — Z114 Encounter for screening for human immunodeficiency virus [HIV]: Secondary | ICD-10-CM

## 2018-02-28 LAB — COMPREHENSIVE METABOLIC PANEL
ALBUMIN: 4.4 g/dL (ref 3.5–5.2)
ALK PHOS: 79 U/L (ref 39–117)
ALT: 19 U/L (ref 0–53)
AST: 15 U/L (ref 0–37)
BUN: 25 mg/dL — AB (ref 6–23)
CALCIUM: 9.8 mg/dL (ref 8.4–10.5)
CHLORIDE: 102 meq/L (ref 96–112)
CO2: 31 mEq/L (ref 19–32)
Creatinine, Ser: 1.22 mg/dL (ref 0.40–1.50)
GFR: 65.1 mL/min (ref >60.00)
Glucose, Bld: 120 mg/dL — ABNORMAL HIGH (ref 70–99)
Potassium: 4.5 mEq/L (ref 3.5–5.1)
SODIUM: 140 meq/L (ref 135–145)
TOTAL PROTEIN: 7 g/dL (ref 6.0–8.3)
Total Bilirubin: 0.6 mg/dL (ref 0.2–1.2)

## 2018-02-28 LAB — HEMOGLOBIN A1C: HEMOGLOBIN A1C: 6.7 % — AB (ref 4.6–6.5)

## 2018-02-28 LAB — LIPID PANEL
CHOLESTEROL: 145 mg/dL (ref 0–200)
HDL: 60.3 mg/dL (ref >39.00)
LDL CALC: 76 mg/dL (ref 0–99)
NonHDL: 84.77
TRIGLYCERIDES: 43 mg/dL (ref 0.0–149.0)
Total CHOL/HDL Ratio: 2
VLDL: 8.6 mg/dL (ref 0.0–40.0)

## 2018-02-28 LAB — PSA: PSA: 3.85 ng/mL (ref 0.10–4.00)

## 2018-02-28 MED ORDER — METFORMIN HCL 1000 MG PO TABS: 1000.0000 mg | ORAL_TABLET | Freq: Two times a day (BID) | ORAL | 1 refills | Status: DC

## 2018-02-28 MED ORDER — LISINOPRIL-HYDROCHLOROTHIAZIDE 20-12.5 MG PO TABS
1.0000 | ORAL_TABLET | Freq: Two times a day (BID) | ORAL | 1 refills | Status: DC
Start: 2018-02-28 — End: 2018-10-22

## 2018-02-28 NOTE — Assessment & Plan Note (Signed)
Repeat PSA today. If remains elevated, refer to urology.

## 2018-02-28 NOTE — Assessment & Plan Note (Signed)
Well controlled. No changes made to rxs. 

## 2018-02-28 NOTE — Assessment & Plan Note (Signed)
Well controlled.  Check a1c today and lipid panel.

## 2018-02-28 NOTE — Patient Instructions (Signed)
Great to see you. I will call you with your lab results from today and you can view them online.   

## 2018-02-28 NOTE — Progress Notes (Signed)
Subjective:   Patient ID: Danny Ferguson, male    DOB: 1960-08-19, 57 y.o.   MRN: [id number]  Danny Ferguson is a pleasant 57 y.o. year old male who presents to clinic today with Follow-up (Patient is here today for a F/U with elevated PSA.  We finally received results from [facility].  PSA: 2.24.17-2.4; 2.26.18-4.1; 12.4.18-4.4.  He agrees to flu shot.  He declines Hep-C and HIV lab draws.  He is requesting refills of Lisinopril HCT & Metformin.) and Elbow Injury (Patient is also C/O left elbow pain. He states that he was working on his sons vehicle and feels like he pulled something in it about 2-weeks-ago.  Not getting better.)  on 02/28/2018  HPI:  Follow up-   DM- Diagnosed in 2014. currently taking Metformin 1000 mgtwice daily.  Checks FSBS every other week. Denies any episodes or symptoms of hypoglycemia. Lab Results  Component Value Date   HGBA1C 6.8 08/29/2017   He is on ACEI.  Lab Results  Component Value Date   CHOL 167 04/25/2017   HDL 57 04/25/2017   LDLCALC 98 04/25/2017   TRIG 61 04/25/2017   CHOLHDL 2.9 04/25/2017    Family h/o prostate CA- dad and grandfather. PSA 4.4 last month. Was previously followed by urology but has not been seen recently and referred referral at last OV. He feels his urinary stream has not changed at all in years. We received records since his last office visit from the practice he went to in [place]- PSAs were- 2.4 on 06/16/15, 4.4. On 05/26/16 Lab Results  Component Value Date   PSA 4.1 07/18/2016   PSA 2.4 07/17/2015     Left elbow injury- two weeks ago, working on a car and pulled something the wrong way.  Now his elbow hurts mainly with supination but sometimes hurts with other movements, moves down forearm. No UE weakness.  Has been wearing an elbow brace which has provided some relief.  Current Outpatient Medications on File Prior to Visit  Medication Sig Dispense Refill  . aspirin EC 81 MG tablet Take 81 mg by  mouth daily.    . cyclobenzaprine (FLEXERIL) 10 MG tablet Take 1 tablet (10 mg total) by mouth 3 (three) times daily as needed for muscle spasms. 30 tablet 0   No current facility-administered medications on file prior to visit.     Allergies  Allergen Reactions  . Penicillins Nausea And Vomiting    Past Medical History:  Diagnosis Date  . Chronic kidney disease   . Diabetes mellitus without complication (HCC)   . Heart murmur   . Hypertension     Past Surgical History:  Procedure Laterality Date  . NO PAST SURGERIES      Family History  Problem Relation Age of Onset  . Diabetes Mother   . Hypertension Mother   . Fibromyalgia Mother   . Hypertension Father   . Cancer Father        Prostate  . Hyperlipidemia Father   . Cancer Maternal Grandmother        Breast  . Diabetes Maternal Grandmother   . Hypertension Maternal Grandmother   . Factor V Leiden deficiency Maternal Grandmother   . Alzheimer's disease Maternal Grandfather   . Mental illness Son 20       Bipolar  . Depression Son   . Cancer Sister        breast cancer (has 2 sisters with Factor V Def; unsure which ones)  .  Alzheimer's disease Paternal Grandmother   . Hypertension Paternal Grandmother   . Cancer Paternal Grandfather        Bone & Prostate  . Diabetes Paternal Grandfather   . Hyperlipidemia Paternal Grandfather   . Hypertension Paternal Grandfather   . Glaucoma Paternal Grandfather     Social History   Socioeconomic History  . Marital status: Married    Spouse name: Anitra  . Number of children: 1  . Years of education: Not on file  . Highest education level: Master's degree (e.g., MA, MS, MEng, MEd, MSW, MBA)  Occupational History  . Occupation: unemployed  Social Needs  . Financial resource strain: Not on file  . Food insecurity:    Worry: Not on file    Inability: Not on file  . Transportation needs:    Medical: Not on file    Non-medical: Not on file  Tobacco Use  . Smoking  status: Never Smoker  . Smokeless tobacco: Never Used  Substance and Sexual Activity  . Alcohol use: No    Alcohol/week: 0.0 standard drinks  . Drug use: No  . Sexual activity: Not on file  Lifestyle  . Physical activity:    Days per week: Not on file    Minutes per session: Not on file  . Stress: Not on file  Relationships  . Social connections:    Talks on phone: Not on file    Gets together: Not on file    Attends religious service: Not on file    Active member of club or organization: Not on file    Attends meetings of clubs or organizations: Not on file    Relationship status: Not on file  . Intimate partner violence:    Fear of current or ex partner: Not on file    Emotionally abused: Not on file    Physically abused: Not on file    Forced sexual activity: Not on file  Other Topics Concern  . Not on file  Social History Narrative   Lives with his wife, his son who was diagnosed with bipolar disorder.   His father lives in [place].   Sisters: [place], [place], [place], [place]   The PMH, PSH, Social History, Family History, Medications, and allergies have been reviewed in [facility], and have been updated if relevant.   Review of Systems  Constitutional: Negative.   HENT: Negative.   Eyes: Negative.   Respiratory: Negative.   Cardiovascular: Negative.   Gastrointestinal: Negative.   Endocrine: Negative.   Genitourinary: Negative.   Musculoskeletal: Positive for arthralgias.  Skin: Negative.   Allergic/Immunologic: Negative.   Neurological: Negative.   Hematological: Negative.   Psychiatric/Behavioral: Negative.   All other systems reviewed and are negative.      Objective:    BP 112/82 (BP Location: Left Arm, Patient Position: Sitting, Cuff Size: Normal)   Pulse 69   Temp 98.5 F (36.9 C) (Oral)   Ht 5\' 10"  (1.778 m)   Wt 190 lb 6.4 oz (86.4 kg)   SpO2 98%   BMI 27.32 kg/m    Physical Exam  Constitutional: He is oriented to person, place, and time. He appears  well-developed and well-nourished. No distress.  HENT:  Head: Normocephalic and atraumatic.  Eyes: EOM are normal.  Neck: Normal range of motion.  Cardiovascular: Normal rate.  Pulmonary/Chest: Effort normal.  Musculoskeletal: Normal range of motion. He exhibits no edema.       Left elbow: He exhibits no swelling, no effusion, no deformity  and no laceration. Tenderness found. Lateral epicondyle tenderness noted. No radial head, no medial epicondyle and no olecranon process tenderness noted.  Neurological: He is alert and oriented to person, place, and time. No cranial nerve deficit.  Skin: Skin is warm and dry. He is not diaphoretic.  Psychiatric: He has a normal mood and affect. His behavior is normal. Judgment and thought content normal.  Nursing note and vitals reviewed.         Assessment & Plan:   Type 2 diabetes mellitus without complication, without long-term current use of insulin (HCC) - Plan: Hemoglobin A1c  Need for influenza vaccination - Plan: Flu Vaccine QUAD 6+ mos PF IM (Fluarix Quad PF)  Benign essential HTN - Plan: Lipid panel  Stage 3 chronic kidney disease (HCC) - Plan: Comprehensive metabolic panel  Elevated PSA, less than 10 ng/ml - Plan: Hemoglobin A1c, PSA  Encounter for screening for HIV - Plan: HIV antibody (with reflex)  Need for hepatitis C screening test - Plan: Hepatitis C Antibody No follow-ups on file.

## 2018-02-28 NOTE — Assessment & Plan Note (Signed)
New- probable sprain. Improving- given exercises from sports med advisor to do at home.  Continue wearing brace.  If symptoms persist, make an appointment with Dr. Jordan Likes. The patient indicates understanding of these issues and agrees with the plan.

## 2018-10-22 ENCOUNTER — Telehealth: Payer: Self-pay | Admitting: Family Medicine

## 2018-10-22 MED ORDER — METFORMIN HCL 1000 MG PO TABS: 1000.0000 mg | ORAL_TABLET | Freq: Two times a day (BID) | ORAL | 1 refills | Status: DC

## 2018-10-22 MED ORDER — LISINOPRIL-HYDROCHLOROTHIAZIDE 20-12.5 MG PO TABS: 1.0000 | ORAL_TABLET | Freq: Two times a day (BID) | ORAL | 1 refills | Status: DC

## 2018-10-22 NOTE — Telephone Encounter (Signed)
Rx refill sent.

## 2018-10-22 NOTE — Telephone Encounter (Signed)
Copied from CRM [id number]. Topic: Quick Communication - Rx Refill/Question >> Oct 22, 2018  9:02 AM Gwenlyn Fudge A wrote: Medication: lisinopril-hydrochlorothiazide (PRINZIDE,ZESTORETIC) 20-12.5 MG tablet  & metFORMIN (GLUCOPHAGE) 1000 MG tablet  Has the patient contacted their pharmacy? No. Pt is requesting to get a 90 day supply of both medications. Pt states he only has 3 days left of medication. Please advise.  (Agent: If no, request that the patient contact the pharmacy for the refill.) (Agent: If yes, when and what did the pharmacy advise?)  Preferred Pharmacy (with phone number or street name): [facility] [address] [place] - [address] [place] [address] Phone: [phone] Fax: [phone] Not a 24 hour pharmacy; exact hours not known.    Agent: Please be advised that RX refills may take up to 3 business days. We ask that you follow-up with your pharmacy.

## 2019-03-28 ENCOUNTER — Encounter: Payer: Managed Care, Other (non HMO) | Admitting: Family Medicine

## 2019-04-01 ENCOUNTER — Telehealth: Payer: Self-pay

## 2019-04-01 DIAGNOSIS — Z0000 Encounter for general adult medical examination without abnormal findings: Secondary | ICD-10-CM | POA: Insufficient documentation

## 2019-04-01 NOTE — Telephone Encounter (Signed)
Questions for Screening COVID-19  Symptom onset: None  Travel or Contacts: None  During this illness, did/does the patient experience any of the following symptoms? Fever >100.21F []   Yes [x]   No []   Unknown Subjective fever (felt feverish) []   Yes [x]   No []   Unknown Chills []   Yes [x]   No []   Unknown Muscle aches (myalgia) []   Yes [x]   No []   Unknown Runny nose (rhinorrhea) []   Yes [x]   No []   Unknown Sore throat []   Yes []   No []   Unknown Cough (new onset or worsening of chronic cough) []   Yes [x]   No []   Unknown Shortness of breath (dyspnea) []   Yes [x]   No []   Unknown Nausea or vomiting []   Yes [x]   No []   Unknown Headache []   Yes [x]   No []   Unknown Abdominal pain  []   Yes [x]   No []   Unknown Diarrhea (?3 loose/looser than normal stools/24hr period) []   Yes [x]   No []   Unknown Other, specify:  Patient risk factors: Smoker? []   Current []   Former []   Never If male, currently pregnant? []   Yes []   No  Patient Active Problem List   Diagnosis Date Noted  . Visit for well man health check 04/01/2019  . Elevated PSA, less than 10 ng/ml 05/15/2017  . Diabetes mellitus type 2, uncomplicated ([place]) 69/45/0388  . Benign essential HTN 04/25/2017  . CKD (chronic kidney disease) 04/25/2017    Plan:  []   High risk for COVID-19 with red flags go to ED (with CP, SOB, weak/lightheaded, or fever > 101.5). Call ahead.  []   High risk for COVID-19 but stable. Inform provider and coordinate time for [facility] visit.   []   No red flags but URI signs or symptoms okay for [facility] visit.

## 2019-04-01 NOTE — Progress Notes (Signed)
Subjective:   Patient ID: Danny Ferguson, male    DOB: [birth year], 58 y.o.   MRN: [id number]  Danny Ferguson is a pleasant 58 y.o. year old male who presents to clinic today with Annual Exam (Pt is here today for a CPE.  He has received his flu vaccine from [facility] on 9.25.20.  He agrees to Tdap.) and Follow-up  on 04/02/2019  HPI:  Health Maintenance  Topic Date Due  . TETANUS/TDAP  04/07/1980  . HEMOGLOBIN A1C  08/30/2018  . OPHTHALMOLOGY EXAM  04/19/2019 (Originally 06/24/2017)  . FOOT EXAM  04/01/2020  . COLONOSCOPY  05/31/2022  . INFLUENZA VACCINE  Completed  . PNEUMOCOCCAL POLYSACCHARIDE VACCINE AGE 74-64 HIGH RISK  Completed  . Hepatitis C Screening  Discontinued  . HIV Screening  Discontinued   Last saw patient on 02/28/18- note reviewed.   DM- Diagnosed in 2014. currently taking Metformin 1000 mgtwice daily.  Checks FSBS every other week. Denies any episodes or symptoms of hypoglycemia. Lab Results  Component Value Date   HGBA1C 6.7 (H) 02/28/2018   He is on ACEI.  The 10-year ASCVD risk score Denman George DC Montez Hageman., et al., 2013) is: 9%   Values used to calculate the score:     Age: 58 years     Sex: Male     Is Non-Hispanic African American: No     Diabetic: Yes     Tobacco smoker: No     Systolic Blood Pressure: 128 mmHg     Is BP treated: Yes     HDL Cholesterol: 60.3 mg/dL     Total Cholesterol: 145 mg/dL   Lab Results  Component Value Date   CHOL 145 02/28/2018   HDL 60.30 02/28/2018   LDLCALC 76 02/28/2018   TRIG 43.0 02/28/2018   CHOLHDL 2 02/28/2018    Family h/o prostate CA- dad and grandfather.  He has had no urinary changes or symptoms. Lab Results  Component Value Date   PSA 3.85 02/28/2018   PSA 4.1 07/18/2016   PSA 2.4 07/17/2015     HTN- has been well controlled on current dose of zestoretic.  Denies any dizziness, HA, blurred vision, CP or SOB.  Lab Results  Component Value Date   CREATININE 1.22 02/28/2018   Depression screen [facility] 2/9  04/02/2019 04/25/2017 12/03/2014  Decreased Interest 0 0 0  Down, Depressed, Hopeless 0 0 0  PHQ - 2 Score 0 0 0  Altered sleeping 0 - -  Tired, decreased energy 0 - -  Change in appetite 0 - -  Feeling bad or failure about yourself  0 - -  Trouble concentrating 0 - -  Moving slowly or fidgety/restless 0 - -  Suicidal thoughts 0 - -  PHQ-9 Score 0 - -  Difficult doing work/chores Not difficult at all - -   GAD 7 : Generalized Anxiety Score 04/02/2019  Nervous, Anxious, on Edge 0  Control/stop worrying 0  Worry too much - different things 0  Trouble relaxing 0  Restless 0  Easily annoyed or irritable 0  Afraid - awful might happen 0  Total GAD 7 Score 0  Anxiety Difficulty Not difficult at all       Current Outpatient Medications on File Prior to Visit  Medication Sig Dispense Refill  . aspirin EC 81 MG tablet Take 81 mg by mouth daily.    . cyclobenzaprine (FLEXERIL) 10 MG tablet Take 1 tablet (10 mg total) by mouth 3 (three) times daily as needed  for muscle spasms. 30 tablet 0  . lisinopril-hydrochlorothiazide (ZESTORETIC) 20-12.5 MG tablet Take 1 tablet by mouth 2 (two) times daily. 180 tablet 1  . metFORMIN (GLUCOPHAGE) 1000 MG tablet Take 1 tablet (1,000 mg total) by mouth 2 (two) times daily with a meal. 180 tablet 1   No current facility-administered medications on file prior to visit.     Allergies  Allergen Reactions  . Penicillins Nausea And Vomiting    Past Medical History:  Diagnosis Date  . Chronic kidney disease   . Diabetes mellitus without complication (HCC)   . Heart murmur   . Hypertension     Past Surgical History:  Procedure Laterality Date  . NO PAST SURGERIES      Family History  Problem Relation Age of Onset  . Diabetes Mother   . Hypertension Mother   . Fibromyalgia Mother   . Hypertension Father   . Cancer Father        Prostate  . Hyperlipidemia Father   . Cancer Maternal Grandmother        Breast  . Diabetes Maternal  Grandmother   . Hypertension Maternal Grandmother   . Factor V Leiden deficiency Maternal Grandmother   . Alzheimer's disease Maternal Grandfather   . Mental illness Son [id number]       Bipolar  . Depression Son   . Cancer Sister        breast cancer (has 2 sisters with Factor V Def; unsure which ones)  . Alzheimer's disease Paternal Grandmother   . Hypertension Paternal Grandmother   . Cancer Paternal Grandfather        Bone & Prostate  . Diabetes Paternal Grandfather   . Hyperlipidemia Paternal Grandfather   . Hypertension Paternal Grandfather   . Glaucoma Paternal Grandfather     Social History   Socioeconomic History  . Marital status: Married    Spouse name: Anitra  . Number of children: 1  . Years of education: Not on file  . Highest education level: Master's degree (e.g., MA, MS, MEng, MEd, MSW, MBA)  Occupational History  . Occupation: unemployed  Social Needs  . Financial resource strain: Not on file  . Food insecurity    Worry: Not on file    Inability: Not on file  . Transportation needs    Medical: Not on file    Non-medical: Not on file  Tobacco Use  . Smoking status: Never Smoker  . Smokeless tobacco: Never Used  Substance and Sexual Activity  . Alcohol use: No    Alcohol/week: 0.0 standard drinks  . Drug use: No  . Sexual activity: Not on file  Lifestyle  . Physical activity    Days per week: Not on file    Minutes per session: Not on file  . Stress: Not on file  Relationships  . Social Musicianconnections    Talks on phone: Not on file    Gets together: Not on file    Attends religious service: Not on file    Active member of club or organization: Not on file    Attends meetings of clubs or organizations: Not on file    Relationship status: Not on file  . Intimate partner violence    Fear of current or ex partner: Not on file    Emotionally abused: Not on file    Physically abused: Not on file    Forced sexual activity: Not on file  Other Topics Concern   . Not on file  Social History Narrative   Lives with his wife, his son who was diagnosed with bipolar disorder.   His father lives in [place].   Sisters: [place], [place], [place], [place]   The PMH, PSH, Social History, Family History, Medications, and allergies have been reviewed in [facility], and have been updated if relevant.   Review of Systems  Constitutional: Negative.   HENT: Negative.   Eyes: Negative.   Respiratory: Negative.   Cardiovascular: Negative.   Gastrointestinal: Negative.   Endocrine: Negative.   Genitourinary: Negative.   Musculoskeletal: Negative for arthralgias.  Skin: Negative.   Allergic/Immunologic: Negative.   Neurological: Negative.   Hematological: Negative.   Psychiatric/Behavioral: Negative.   All other systems reviewed and are negative.      Objective:    BP 128/84 (BP Location: Left Arm, Patient Position: Sitting, Cuff Size: Normal)   Pulse 78   Temp 98.6 F (37 C) (Oral)   Ht 5' 10.5" (1.791 m)   Wt 190 lb 6.4 oz (86.4 kg)   SpO2 97%   BMI 26.93 kg/m    Physical Exam Vitals signs and nursing note reviewed.  Constitutional:      General: He is not in acute distress.    Appearance: He is well-developed. He is not diaphoretic.  HENT:     Head: Normocephalic and atraumatic.  Neck:     Musculoskeletal: Normal range of motion.  Cardiovascular:     Rate and Rhythm: Normal rate.  Pulmonary:     Effort: Pulmonary effort is normal.  Musculoskeletal: Normal range of motion.     Left elbow: He exhibits no swelling, no effusion, no deformity and no laceration. No tenderness found. No radial head, no medial epicondyle, no lateral epicondyle and no olecranon process tenderness noted.  Skin:    General: Skin is warm and dry.  Neurological:     Mental Status: He is alert and oriented to person, place, and time.     Cranial Nerves: No cranial nerve deficit.  Psychiatric:        Behavior: Behavior normal.        Thought Content: Thought content  normal.        Judgment: Judgment normal.           Assessment & Plan:   Visit for well man health check  Benign essential HTN - Plan: Comprehensive metabolic panel, Lipid panel, CBC with Differential/Platelet  Stage 3 chronic kidney disease, unspecified whether stage 3a or 3b CKD - Plan: Comprehensive metabolic panel  Elevated PSA, less than 10 ng/ml - Plan: PSA  Type 2 diabetes mellitus without complication, without long-term current use of insulin (HCC) - Plan: Hemoglobin A1c, Lipid panel  Need for prophylactic vaccination with combined diphtheria-tetanus-pertussis (DTP) vaccine - Plan: Tdap vaccine greater than or equal to 7yo IM No follow-ups on file.

## 2019-04-01 NOTE — Patient Instructions (Addendum)
Happy Birthday!  Great to see you. I will call you with your lab results from today and you can view them online.   Ask your insurance company covers Shingrix- and if it is cheaper if you get at your doctor's office or the pharmacy.

## 2019-04-02 ENCOUNTER — Other Ambulatory Visit: Payer: Self-pay

## 2019-04-02 ENCOUNTER — Ambulatory Visit (INDEPENDENT_AMBULATORY_CARE_PROVIDER_SITE_OTHER): Payer: BC Managed Care – PPO | Admitting: Family Medicine

## 2019-04-02 ENCOUNTER — Encounter: Payer: Self-pay | Admitting: Family Medicine

## 2019-04-02 VITALS — BP 128/84 | HR 78 | Temp 98.6°F | Ht 70.5 in | Wt 190.4 lb

## 2019-04-02 DIAGNOSIS — R972 Elevated prostate specific antigen [PSA]: Secondary | ICD-10-CM | POA: Diagnosis not present

## 2019-04-02 DIAGNOSIS — E119 Type 2 diabetes mellitus without complications: Secondary | ICD-10-CM | POA: Diagnosis not present

## 2019-04-02 DIAGNOSIS — Z0000 Encounter for general adult medical examination without abnormal findings: Secondary | ICD-10-CM

## 2019-04-02 DIAGNOSIS — Z23 Encounter for immunization: Secondary | ICD-10-CM

## 2019-04-02 DIAGNOSIS — N183 Chronic kidney disease, stage 3 unspecified: Secondary | ICD-10-CM | POA: Diagnosis not present

## 2019-04-02 DIAGNOSIS — I10 Essential (primary) hypertension: Secondary | ICD-10-CM | POA: Diagnosis not present

## 2019-04-02 LAB — CBC WITH DIFFERENTIAL/PLATELET
Basophils Absolute: 0 10*3/uL (ref 0.0–0.1)
Basophils Relative: 0.3 % (ref 0.0–3.0)
Eosinophils Absolute: 0.1 10*3/uL (ref 0.0–0.7)
Eosinophils Relative: 1.3 % (ref 0.0–5.0)
HCT: 45 % (ref 39.0–52.0)
Hemoglobin: 15.3 g/dL (ref 13.0–17.0)
Lymphocytes Relative: 20.3 % (ref 12.0–46.0)
Lymphs Abs: 1.4 10*3/uL (ref 0.7–4.0)
MCHC: 34 g/dL (ref 30.0–36.0)
MCV: 84.7 fl (ref 78.0–100.0)
Monocytes Absolute: 0.5 10*3/uL (ref 0.1–1.0)
Monocytes Relative: 7.2 % (ref 3.0–12.0)
Neutro Abs: 4.9 10*3/uL (ref 1.4–7.7)
Neutrophils Relative %: 70.9 % (ref 43.0–77.0)
Platelets: 267 10*3/uL (ref 150.0–400.0)
RBC: 5.31 Mil/uL (ref 4.22–5.81)
RDW: 14.5 % (ref 11.5–15.5)
WBC: 6.9 10*3/uL (ref 4.0–10.5)

## 2019-04-02 LAB — COMPREHENSIVE METABOLIC PANEL
ALT: 23 U/L (ref 0–53)
AST: 18 U/L (ref 0–37)
Albumin: 4.5 g/dL (ref 3.5–5.2)
Alkaline Phosphatase: 84 U/L (ref 39–117)
BUN: 18 mg/dL (ref 6–23)
CO2: 29 mEq/L (ref 19–32)
Calcium: 9.2 mg/dL (ref 8.4–10.5)
Chloride: 102 mEq/L (ref 96–112)
Creatinine, Ser: 1.14 mg/dL (ref 0.40–1.50)
GFR: 65.98 mL/min (ref >60.00)
Glucose, Bld: 112 mg/dL — ABNORMAL HIGH (ref 70–99)
Potassium: 3.8 mEq/L (ref 3.5–5.1)
Sodium: 139 mEq/L (ref 135–145)
Total Bilirubin: 1 mg/dL (ref 0.2–1.2)
Total Protein: 6.8 g/dL (ref 6.0–8.3)

## 2019-04-02 LAB — LIPID PANEL
Cholesterol: 157 mg/dL (ref 0–200)
HDL: 64.3 mg/dL (ref >39.00)
LDL Cholesterol: 83 mg/dL (ref 0–99)
NonHDL: 93.1
Total CHOL/HDL Ratio: 2
Triglycerides: 53 mg/dL (ref 0.0–149.0)
VLDL: 10.6 mg/dL (ref 0.0–40.0)

## 2019-04-02 LAB — PSA: PSA: 6.85 ng/mL — ABNORMAL HIGH (ref 0.10–4.00)

## 2019-04-02 LAB — HEMOGLOBIN A1C: Hgb A1c MFr Bld: 6.8 % — ABNORMAL HIGH (ref 4.6–6.5)

## 2019-04-02 NOTE — Assessment & Plan Note (Addendum)
Continue current dose of Metformin. Check labs today. On ACEI.   Not on a statin- relatively low ascvd score. Foot exam, labs today. Orders Placed This Encounter  Procedures  . Hemoglobin A1c  . Comprehensive metabolic panel  . PSA  . Lipid panel  . CBC with Differential/Platelet

## 2019-04-02 NOTE — Assessment & Plan Note (Signed)
Labs today

## 2019-04-02 NOTE — Assessment & Plan Note (Signed)
PSA today

## 2019-04-02 NOTE — Assessment & Plan Note (Addendum)
Reviewed preventive care protocols, scheduled due services, and updated immunizations Discussed nutrition, exercise, diet, and healthy lifestyle.  Tdap given today. 

## 2019-04-02 NOTE — Assessment & Plan Note (Signed)
Well controlled on current dose of zestoretic. No changes made.  Orders Placed This Encounter  Procedures  . Tdap vaccine greater than or equal to 58yo IM  . Hemoglobin A1c  . Comprehensive metabolic panel  . PSA  . Lipid panel  . CBC with Differential/Platelet

## 2019-04-03 ENCOUNTER — Other Ambulatory Visit: Payer: Self-pay | Admitting: Family Medicine

## 2019-04-03 DIAGNOSIS — R972 Elevated prostate specific antigen [PSA]: Secondary | ICD-10-CM

## 2019-05-02 DIAGNOSIS — N401 Enlarged prostate with lower urinary tract symptoms: Secondary | ICD-10-CM | POA: Diagnosis not present

## 2019-05-02 DIAGNOSIS — R972 Elevated prostate specific antigen [PSA]: Secondary | ICD-10-CM | POA: Diagnosis not present

## 2019-05-02 DIAGNOSIS — R3912 Poor urinary stream: Secondary | ICD-10-CM | POA: Diagnosis not present

## 2019-06-06 DIAGNOSIS — Z8042 Family history of malignant neoplasm of prostate: Secondary | ICD-10-CM | POA: Diagnosis not present

## 2019-06-06 DIAGNOSIS — R972 Elevated prostate specific antigen [PSA]: Secondary | ICD-10-CM | POA: Diagnosis not present

## 2019-06-14 ENCOUNTER — Other Ambulatory Visit: Payer: Self-pay | Admitting: Family Medicine

## 2019-06-14 NOTE — Telephone Encounter (Signed)
Last ov 04/02/19 Last fill 10/22/18  #180/1

## 2019-06-18 DIAGNOSIS — N401 Enlarged prostate with lower urinary tract symptoms: Secondary | ICD-10-CM | POA: Diagnosis not present

## 2019-06-18 DIAGNOSIS — R3912 Poor urinary stream: Secondary | ICD-10-CM | POA: Diagnosis not present

## 2019-06-18 DIAGNOSIS — R972 Elevated prostate specific antigen [PSA]: Secondary | ICD-10-CM | POA: Diagnosis not present

## 2019-07-22 ENCOUNTER — Telehealth: Payer: Self-pay | Admitting: Family Medicine

## 2019-07-22 NOTE — Telephone Encounter (Signed)
Patient is calling and is a former patient of Dr. Dayton Martes. Patient is requesting a TOC to Dr. Doreene Burke. Please advise.

## 2019-07-23 NOTE — Telephone Encounter (Signed)
Okay for TOC 

## 2019-07-23 NOTE — Telephone Encounter (Signed)
That would be okay

## 2019-07-24 NOTE — Telephone Encounter (Signed)
Pt aware and will call back at later date to schedule

## 2019-10-07 ENCOUNTER — Telehealth: Payer: Self-pay | Admitting: Family Medicine

## 2019-10-07 NOTE — Telephone Encounter (Signed)
Last OV 04/02/19 Last fill for metformin 10/22/18  #180/1 Last fill for Zestoretic  06/14/19  #180/0 Next OV 11/08/2019

## 2019-10-07 NOTE — Telephone Encounter (Signed)
Patient has TOC appt scheduled for 11/08/19 but is about to run out of Metformin and Zestoretic. He would like a refill to get him through to his [facility] appt with Dr. Salena Saner.

## 2019-10-09 MED ORDER — LISINOPRIL-HYDROCHLOROTHIAZIDE 20-12.5 MG PO TABS: 1.0000 | ORAL_TABLET | Freq: Two times a day (BID) | ORAL | 3 refills | Status: DC

## 2019-10-09 MED ORDER — METFORMIN HCL 1000 MG PO TABS: 1000.0000 mg | ORAL_TABLET | Freq: Two times a day (BID) | ORAL | 3 refills | Status: DC

## 2019-10-09 NOTE — Telephone Encounter (Signed)
Rx x 2 refilled.

## 2019-11-07 ENCOUNTER — Other Ambulatory Visit: Payer: Self-pay

## 2019-11-08 ENCOUNTER — Encounter: Payer: Self-pay | Admitting: Family Medicine

## 2019-11-08 ENCOUNTER — Ambulatory Visit: Payer: BC Managed Care – PPO | Admitting: Family Medicine

## 2019-11-08 VITALS — BP 140/90 | HR 68 | Temp 97.9°F | Ht 70.5 in | Wt 195.6 lb

## 2019-11-08 DIAGNOSIS — I10 Essential (primary) hypertension: Secondary | ICD-10-CM

## 2019-11-08 DIAGNOSIS — E119 Type 2 diabetes mellitus without complications: Secondary | ICD-10-CM

## 2019-11-08 DIAGNOSIS — R972 Elevated prostate specific antigen [PSA]: Secondary | ICD-10-CM

## 2019-11-08 NOTE — Addendum Note (Signed)
Addended by: Varney Biles on: 11/08/2019 03:07 PM   Modules accepted: Orders

## 2019-11-08 NOTE — Addendum Note (Signed)
Addended by: Varney Biles on: 11/08/2019 03:17 PM   Modules accepted: Orders

## 2019-11-08 NOTE — Progress Notes (Signed)
Danny Ferguson is a 59 y.o. male  Chief Complaint  Patient presents with  . Establish Care    TOC/medication refill.  Medication discussion/    HPI: Danny Ferguson is a 59 y.o. male here today for [facility] appt, previous PCP Dr. Dayton Martes. Pt has a PMHx significant for DM, HTN.  He does not need any med refills.  He has no acute issues or concerns today.  Due in 03/2020 for CPE.  Last labs 03/2019: Lab Results  Component Value Date   HGBA1C 6.8 (H) 04/02/2019   Last metabolic panel Lab Results  Component Value Date   GLUCOSE 112 (H) 04/02/2019   NA 139 04/02/2019   K 3.8 04/02/2019   CL 102 04/02/2019   CO2 29 04/02/2019   BUN 18 04/02/2019   CREATININE 1.14 04/02/2019   GFRNONAA 65 04/25/2017   GFRAA 75 04/25/2017   CALCIUM 9.2 04/02/2019   PROT 6.8 04/02/2019   ALBUMIN 4.5 04/02/2019   LABGLOB 2.5 04/25/2017   AGRATIO 1.8 04/25/2017   BILITOT 1.0 04/02/2019   ALKPHOS 84 04/02/2019   AST 18 04/02/2019   ALT 23 04/02/2019   Lab Results  Component Value Date   CHOL 157 04/02/2019   HDL 64.30 04/02/2019   LDLCALC 83 04/02/2019   TRIG 53.0 04/02/2019   CHOLHDL 2 04/02/2019    Past Medical History:  Diagnosis Date  . Chronic kidney disease   . Diabetes mellitus without complication (HCC)   . Heart murmur   . Hypertension     Past Surgical History:  Procedure Laterality Date  . NO PAST SURGERIES      Social History   Socioeconomic History  . Marital status: Married    Spouse name: Anitra  . Number of children: 1  . Years of education: Not on file  . Highest education level: Master's degree (e.g., MA, MS, MEng, MEd, MSW, MBA)  Occupational History  . Occupation: unemployed  Tobacco Use  . Smoking status: Never Smoker  . Smokeless tobacco: Never Used  Vaping Use  . Vaping Use: Never used  Substance and Sexual Activity  . Alcohol use: No    Alcohol/week: 0.0 standard drinks  . Drug use: No  . Sexual activity: Not on file  Other Topics Concern  . Not  on file  Social History Narrative   Lives with his wife, his son who was diagnosed with bipolar disorder.   His father lives in [place].   Sisters: [place], [place], [place], [place]   Social Determinants of Health   Financial Resource Strain:   . Difficulty of Paying Living Expenses:   Food Insecurity:   . Worried About Programme researcher, broadcasting/film/video in the Last Year:   . Barista in the Last Year:   Transportation Needs:   . Freight forwarder (Medical):   Marland Kitchen Lack of Transportation (Non-Medical):   Physical Activity:   . Days of Exercise per Week:   . Minutes of Exercise per Session:   Stress:   . Feeling of Stress :   Social Connections:   . Frequency of Communication with Friends and Family:   . Frequency of Social Gatherings with Friends and Family:   . Attends Religious Services:   . Active Member of Clubs or Organizations:   . Attends Banker Meetings:   Marland Kitchen Marital Status:   Intimate Partner Violence:   . Fear of Current or Ex-Partner:   . Emotionally Abused:   Marland Kitchen Physically Abused:   .  Sexually Abused:     Family History  Problem Relation Age of Onset  . Diabetes Mother   . Hypertension Mother   . Fibromyalgia Mother   . Hypertension Father   . Cancer Father        Prostate  . Hyperlipidemia Father   . Cancer Maternal Grandmother        Breast  . Diabetes Maternal Grandmother   . Hypertension Maternal Grandmother   . Factor V Leiden deficiency Maternal Grandmother   . Alzheimer's disease Maternal Grandfather   . Mental illness Son 9       Bipolar  . Depression Son   . Cancer Sister        breast cancer (has 2 sisters with Factor V Def; unsure which ones)  . Alzheimer's disease Paternal Grandmother   . Hypertension Paternal Grandmother   . Cancer Paternal Grandfather        Bone & Prostate  . Diabetes Paternal Grandfather   . Hyperlipidemia Paternal Grandfather   . Hypertension Paternal Grandfather   . Glaucoma Paternal Grandfather       Immunization History  Administered Date(s) Administered  . Influenza,inj,Quad PF,6+ Mos 04/25/2017, 02/28/2018, 02/15/2019  . Moderna SARS-COVID-2 Vaccination 08/15/2019, 09/12/2019  . Pneumococcal Polysaccharide-23 05/31/2017  . Tdap 04/02/2019    Outpatient Encounter Medications as of 11/08/2019  Medication Sig  . aspirin EC 81 MG tablet Take 81 mg by mouth daily.  . cyclobenzaprine (FLEXERIL) 10 MG tablet Take 1 tablet (10 mg total) by mouth 3 (three) times daily as needed for muscle spasms.  Marland Kitchen lisinopril-hydrochlorothiazide (ZESTORETIC) 20-12.5 MG tablet Take 1 tablet by mouth 2 (two) times daily.  . metFORMIN (GLUCOPHAGE) 1000 MG tablet Take 1 tablet (1,000 mg total) by mouth 2 (two) times daily with a meal. (Patient taking differently: Take 1,000 mg by mouth daily with breakfast. )   No facility-administered encounter medications on file as of 11/08/2019.     ROS: Pertinent positives and negatives noted in HPI. Remainder of ROS non-contributory    Allergies  Allergen Reactions  . Penicillins Nausea And Vomiting    BP 140/90 (BP Location: Left Arm, Patient Position: Sitting, Cuff Size: Normal)   Pulse 68   Temp 97.9 F (36.6 C) (Temporal)   Ht 5' 10.5" (1.791 m)   Wt 195 lb 9.6 oz (88.7 kg)   SpO2 98%   BMI 27.67 kg/m    BP Readings from Last 3 Encounters:  11/08/19 140/90  04/02/19 128/84  02/28/18 112/82   Pulse Readings from Last 3 Encounters:  11/08/19 68  04/02/19 78  02/28/18 69   Wt Readings from Last 3 Encounters:  11/08/19 195 lb 9.6 oz (88.7 kg)  04/02/19 190 lb 6.4 oz (86.4 kg)  02/28/18 190 lb 6.4 oz (86.4 kg)    Physical Exam Constitutional:      Appearance: Normal appearance.  Cardiovascular:     Rate and Rhythm: Normal rate and regular rhythm.  Pulmonary:     Effort: Pulmonary effort is normal.     Breath sounds: Normal breath sounds.  Neurological:     Mental Status: He is alert and oriented to person, place, and time.   Psychiatric:        Mood and Affect: Mood normal.        Behavior: Behavior normal.      A/P:  1. Type 2 diabetes mellitus without complication, without long-term current use of insulin (HCC) - cont metformin 1000mg  daily  - Hemoglobin A1c -  Microalbumin / creatinine urine ratio - pt would like to do all labs in 03/2019  2. Benign essential HTN - slightly elevated today, previously controlled - cont current med  3. Elevated PSA, less than 10 ng/ml - has seen urology and due for f/u    This visit occurred during the SARS-CoV-2 public health emergency.  Safety protocols were in place, including screening questions prior to the visit, additional usage of staff PPE, and extensive cleaning of exam room while observing appropriate contact time as indicated for disinfecting solutions.

## 2019-12-16 DIAGNOSIS — R972 Elevated prostate specific antigen [PSA]: Secondary | ICD-10-CM | POA: Diagnosis not present

## 2020-04-21 ENCOUNTER — Other Ambulatory Visit: Payer: Self-pay

## 2020-04-22 ENCOUNTER — Ambulatory Visit (INDEPENDENT_AMBULATORY_CARE_PROVIDER_SITE_OTHER): Payer: BC Managed Care – PPO | Admitting: Family Medicine

## 2020-04-22 ENCOUNTER — Encounter: Payer: Self-pay | Admitting: Family Medicine

## 2020-04-22 VITALS — BP 158/86 | HR 61 | Temp 97.2°F | Ht 70.0 in | Wt 198.6 lb

## 2020-04-22 DIAGNOSIS — Z0000 Encounter for general adult medical examination without abnormal findings: Secondary | ICD-10-CM

## 2020-04-22 DIAGNOSIS — R972 Elevated prostate specific antigen [PSA]: Secondary | ICD-10-CM

## 2020-04-22 DIAGNOSIS — N183 Chronic kidney disease, stage 3 unspecified: Secondary | ICD-10-CM | POA: Diagnosis not present

## 2020-04-22 DIAGNOSIS — E119 Type 2 diabetes mellitus without complications: Secondary | ICD-10-CM

## 2020-04-22 DIAGNOSIS — I10 Essential (primary) hypertension: Secondary | ICD-10-CM | POA: Diagnosis not present

## 2020-04-22 DIAGNOSIS — Z23 Encounter for immunization: Secondary | ICD-10-CM | POA: Diagnosis not present

## 2020-04-22 LAB — MICROALBUMIN / CREATININE URINE RATIO
Creatinine,U: 70.7 mg/dL
Microalb Creat Ratio: 1.2 mg/g (ref 0.0–30.0)
Microalb, Ur: 0.8 mg/dL (ref 0.0–1.9)

## 2020-04-22 LAB — COMPREHENSIVE METABOLIC PANEL
ALT: 22 U/L (ref 0–53)
AST: 17 U/L (ref 0–37)
Albumin: 4.5 g/dL (ref 3.5–5.2)
Alkaline Phosphatase: 87 U/L (ref 39–117)
BUN: 16 mg/dL (ref 6–23)
CO2: 29 mEq/L (ref 19–32)
Calcium: 9.3 mg/dL (ref 8.4–10.5)
Chloride: 103 mEq/L (ref 96–112)
Creatinine, Ser: 1.16 mg/dL (ref 0.40–1.50)
GFR: 69.17 mL/min (ref >60.00)
Glucose, Bld: 140 mg/dL — ABNORMAL HIGH (ref 70–99)
Potassium: 4 mEq/L (ref 3.5–5.1)
Sodium: 139 mEq/L (ref 135–145)
Total Bilirubin: 1.2 mg/dL (ref 0.2–1.2)
Total Protein: 7 g/dL (ref 6.0–8.3)

## 2020-04-22 LAB — LIPID PANEL
Cholesterol: 151 mg/dL (ref 0–200)
HDL: 59.8 mg/dL (ref >39.00)
LDL Cholesterol: 80 mg/dL (ref 0–99)
NonHDL: 90.87
Total CHOL/HDL Ratio: 3
Triglycerides: 54 mg/dL (ref 0.0–149.0)
VLDL: 10.8 mg/dL (ref 0.0–40.0)

## 2020-04-22 LAB — CBC
HCT: 45.7 % (ref 39.0–52.0)
Hemoglobin: 15.2 g/dL (ref 13.0–17.0)
MCHC: 33.3 g/dL (ref 30.0–36.0)
MCV: 84.8 fl (ref 78.0–100.0)
Platelets: 288 10*3/uL (ref 150.0–400.0)
RBC: 5.38 Mil/uL (ref 4.22–5.81)
RDW: 14.2 % (ref 11.5–15.5)
WBC: 7.9 10*3/uL (ref 4.0–10.5)

## 2020-04-22 LAB — HEMOGLOBIN A1C: Hgb A1c MFr Bld: 8.1 % — ABNORMAL HIGH (ref 4.6–6.5)

## 2020-04-22 MED ORDER — LISINOPRIL-HYDROCHLOROTHIAZIDE 20-25 MG PO TABS: 1.0000 | ORAL_TABLET | Freq: Every day | ORAL | 3 refills | Status: DC

## 2020-04-22 MED ORDER — METFORMIN HCL 1000 MG PO TABS: 1000.0000 mg | ORAL_TABLET | Freq: Two times a day (BID) | ORAL | 3 refills | Status: DC

## 2020-04-22 NOTE — Patient Instructions (Signed)
Please check BP 2-3x/wk for 2wks and then send message thru MyChart or call with those readings

## 2020-04-22 NOTE — Progress Notes (Signed)
Danny Ferguson is a 59 y.o. male  Chief Complaint  Patient presents with  . Annual Exam    CPE, pt fasting for labs no concerns.     HPI: Danny Ferguson is a 59 y.o. male is a former patient of Dr. Dayton Martes who is seen today for annual CPE, fasting labs. He has no acute issues or concerns. He would like flu and shingrix vaccines today.  He follows at [facility] for elevated PSA level, last PSA in 11/2019. He has appt in 05/2020. Pt does not check BP at home but does have a wrist cuff at home.   Last Colonoscopy: pt is unsure - thinks around 59yo Dental: due for exam Vision: wears glasses/contacts - due for exam  Diet: pt has been eating out more in the past 6 mo Exercise: no regular   Med refills needed today: see orders  Past Medical History:  Diagnosis Date  . Chronic kidney disease   . Diabetes mellitus without complication (HCC)   . Heart murmur   . Hypertension     Past Surgical History:  Procedure Laterality Date  . NO PAST SURGERIES      Social History   Socioeconomic History  . Marital status: Married    Spouse name: Anitra  . Number of children: 1  . Years of education: Not on file  . Highest education level: Master's degree (e.g., MA, MS, MEng, MEd, MSW, MBA)  Occupational History  . Occupation: unemployed  Tobacco Use  . Smoking status: Never Smoker  . Smokeless tobacco: Never Used  Vaping Use  . Vaping Use: Never used  Substance and Sexual Activity  . Alcohol use: No    Alcohol/week: 0.0 standard drinks  . Drug use: No  . Sexual activity: Not on file  Other Topics Concern  . Not on file  Social History Narrative   Lives with his wife, his son who was diagnosed with bipolar disorder.   His father lives in [place].   Sisters: [place], [place], [place], [place]   Social Determinants of Health   Financial Resource Strain:   . Difficulty of Paying Living Expenses: Not on file  Food Insecurity:   . Worried About Programme researcher, broadcasting/film/video in the Last Year:  Not on file  . Ran Out of Food in the Last Year: Not on file  Transportation Needs:   . Lack of Transportation (Medical): Not on file  . Lack of Transportation (Non-Medical): Not on file  Physical Activity:   . Days of Exercise per Week: Not on file  . Minutes of Exercise per Session: Not on file  Stress:   . Feeling of Stress : Not on file  Social Connections:   . Frequency of Communication with Friends and Family: Not on file  . Frequency of Social Gatherings with Friends and Family: Not on file  . Attends Religious Services: Not on file  . Active Member of Clubs or Organizations: Not on file  . Attends Banker Meetings: Not on file  . Marital Status: Not on file  Intimate Partner Violence:   . Fear of Current or Ex-Partner: Not on file  . Emotionally Abused: Not on file  . Physically Abused: Not on file  . Sexually Abused: Not on file    Family History  Problem Relation Age of Onset  . Diabetes Mother   . Hypertension Mother   . Fibromyalgia Mother   . Hypertension Father   . Cancer Father  Prostate  . Hyperlipidemia Father   . Cancer Maternal Grandmother        Breast  . Diabetes Maternal Grandmother   . Hypertension Maternal Grandmother   . Factor V Leiden deficiency Maternal Grandmother   . Alzheimer's disease Maternal Grandfather   . Mental illness Son 68       Bipolar  . Depression Son   . Cancer Sister        breast cancer (has 2 sisters with Factor V Def; unsure which ones)  . Alzheimer's disease Paternal Grandmother   . Hypertension Paternal Grandmother   . Cancer Paternal Grandfather        Bone & Prostate  . Diabetes Paternal Grandfather   . Hyperlipidemia Paternal Grandfather   . Hypertension Paternal Grandfather   . Glaucoma Paternal Grandfather      Immunization History  Administered Date(s) Administered  . Influenza,inj,Quad PF,6+ Mos 04/25/2017, 02/28/2018, 02/15/2019  . Moderna SARS-COVID-2 Vaccination 08/15/2019,  09/12/2019, 04/07/2020  . Pneumococcal Polysaccharide-23 05/31/2017  . Tdap 04/02/2019    Outpatient Encounter Medications as of 04/22/2020  Medication Sig  . lisinopril-hydrochlorothiazide (ZESTORETIC) 20-12.5 MG tablet Take 1 tablet by mouth 2 (two) times daily.  . metFORMIN (GLUCOPHAGE) 1000 MG tablet Take 1 tablet (1,000 mg total) by mouth 2 (two) times daily with a meal. (Patient taking differently: Take 1,000 mg by mouth daily with breakfast. )  . aspirin EC 81 MG tablet Take 81 mg by mouth daily. (Patient not taking: Reported on 04/22/2020)  . cyclobenzaprine (FLEXERIL) 10 MG tablet Take 1 tablet (10 mg total) by mouth 3 (three) times daily as needed for muscle spasms. (Patient not taking: Reported on 04/22/2020)   No facility-administered encounter medications on file as of 04/22/2020.     ROS: Gen: no fever, chills  Skin: no rash, itching ENT: no ear pain, ear drainage, nasal congestion, rhinorrhea, sinus pressure, sore throat Eyes: no blurry vision, double vision Resp: no cough, wheeze,SOB CV: no CP, palpitations, LE edema,  GI: no heartburn, n/v/d/c, abd pain GU: no dysuria, urgency, frequency, hematuria; no testicular swelling or masses MSK: no joint pain, myalgias, back pain Neuro: no dizziness, headache, weakness, vertigo Psych: no depression, anxiety, insomnia   Allergies  Allergen Reactions  . Penicillins Nausea And Vomiting    BP (!) 160/98   Pulse 61   Temp (!) 97.2 F (36.2 C) (Temporal)   Ht 5\' 10"  (1.778 m)   Wt 198 lb 9.6 oz (90.1 kg)   SpO2 97%   BMI 28.50 kg/m    BP Readings from Last 3 Encounters:  04/22/20 (!) 160/98  11/08/19 140/90  04/02/19 128/84   Pulse Readings from Last 3 Encounters:  04/22/20 61  11/08/19 68  04/02/19 78   Wt Readings from Last 3 Encounters:  04/22/20 198 lb 9.6 oz (90.1 kg)  11/08/19 195 lb 9.6 oz (88.7 kg)  04/02/19 190 lb 6.4 oz (86.4 kg)    Physical Exam Constitutional:      General: He is not in acute  distress.    Appearance: He is well-developed.  HENT:     Head: Normocephalic and atraumatic.     Right Ear: Tympanic membrane and ear canal normal.     Left Ear: Tympanic membrane and ear canal normal.     Nose: Nose normal.  Neck:     Thyroid: No thyromegaly.  Cardiovascular:     Rate and Rhythm: Normal rate and regular rhythm.     Heart sounds: Normal heart sounds.  Pulmonary:     Effort: Pulmonary effort is normal.     Breath sounds: Normal breath sounds. No wheezing or rhonchi.  Abdominal:     General: Bowel sounds are normal. There is no distension.     Palpations: Abdomen is soft. There is no mass.     Tenderness: There is no abdominal tenderness. There is no guarding or rebound.  Musculoskeletal:     Cervical back: Neck supple.     Right lower leg: No edema.     Left lower leg: No edema.  Lymphadenopathy:     Cervical: No cervical adenopathy.  Skin:    General: Skin is warm and dry.  Neurological:     Mental Status: He is alert and oriented to person, place, and time.     Motor: No abnormal muscle tone.     Coordination: Coordination normal.  Psychiatric:        Mood and Affect: Mood normal.        Behavior: Behavior normal.      A/P:  1. Annual physical exam - discussed importance of regular CV exercise, healthy diet, adequate sleep - due for dental and vision exams - unsure of last colonoscopy, pt thinks he had this at 59yo so 9 years ago. Will review records from previous PCP in [place] to see when pts last colo was - flu and singles vaccines today - Lipid panel - CBC - Comprehensive metabolic panel - next CPE in 1 year  2. Benign essential HTN - not controlled - pt does not exercise and is not conscious of sodium intake - CBC - Comprehensive metabolic panel Increase: - lisinopril-hydrochlorothiazide (ZESTORETIC) 20-25 MG tablet; Take 1 tablet by mouth daily.  Dispense: 90 tablet; Refill: 3 - increased from 20-12.5mg  daily - pt to check BP 2-3x/wk x 2 wks  and then send mychart message or call with readings in 2wks  3. Elevated PSA, less than 10 ng/ml - follows with urology, next appt in 05/2020  4. Type 2 diabetes mellitus without complication, without long-term current use of insulin (HCC) - last A1C = 6.8 in 10/2019 - cont metformin 1000mg  BID - refilled today - Hemoglobin A1c - Microalbumin / creatinine urine ratio  5. Stage 3 chronic kidney disease, unspecified whether stage 3a or 3b CKD (HCC) - Comprehensive metabolic panel  6. Need for shingles vaccine - Varicella-zoster vaccine IM (Shingrix) - RTO in 55mo for RN visit for #2  This visit occurred during the SARS-CoV-2 public health emergency.  Safety protocols were in place, including screening questions prior to the visit, additional usage of staff PPE, and extensive cleaning of exam room while observing appropriate contact time as indicated for disinfecting solutions.

## 2020-04-24 ENCOUNTER — Encounter: Payer: Self-pay | Admitting: Family Medicine

## 2020-04-24 DIAGNOSIS — E119 Type 2 diabetes mellitus without complications: Secondary | ICD-10-CM

## 2020-04-24 DIAGNOSIS — I10 Essential (primary) hypertension: Secondary | ICD-10-CM

## 2020-04-24 MED ORDER — DAPAGLIFLOZIN PROPANEDIOL 5 MG PO TABS: 5.0000 mg | ORAL_TABLET | Freq: Every day | ORAL | 3 refills | Status: DC

## 2020-04-30 MED ORDER — AMLODIPINE BESYLATE 10 MG PO TABS: 10.0000 mg | ORAL_TABLET | Freq: Every day | ORAL | 3 refills | Status: DC

## 2020-05-01 NOTE — Telephone Encounter (Signed)
Please see message and advise.  Thank you. ° °

## 2020-05-06 MED ORDER — PIOGLITAZONE HCL 15 MG PO TABS: 15.0000 mg | ORAL_TABLET | Freq: Every day | ORAL | 3 refills | Status: DC

## 2020-05-19 ENCOUNTER — Encounter: Payer: Self-pay | Admitting: Family Medicine

## 2020-05-20 ENCOUNTER — Ambulatory Visit: Payer: BC Managed Care – PPO | Admitting: Family

## 2020-05-20 ENCOUNTER — Other Ambulatory Visit: Payer: Self-pay

## 2020-05-20 ENCOUNTER — Encounter: Payer: Self-pay | Admitting: Family

## 2020-05-20 VITALS — BP 142/80 | HR 86 | Temp 97.7°F | Ht 70.0 in | Wt 199.0 lb

## 2020-05-20 DIAGNOSIS — R600 Localized edema: Secondary | ICD-10-CM

## 2020-05-20 DIAGNOSIS — R609 Edema, unspecified: Secondary | ICD-10-CM | POA: Diagnosis not present

## 2020-05-20 DIAGNOSIS — I10 Essential (primary) hypertension: Secondary | ICD-10-CM

## 2020-05-20 MED ORDER — LISINOPRIL-HYDROCHLOROTHIAZIDE 20-12.5 MG PO TABS: 2.0000 | ORAL_TABLET | Freq: Every day | ORAL | 1 refills | Status: DC

## 2020-05-20 NOTE — Patient Instructions (Signed)
STOP NORVASC 10 MG    Peripheral Edema  Peripheral edema is swelling that is caused by a buildup of fluid. Peripheral edema most often affects the lower legs, ankles, and feet. It can also develop in the arms, hands, and face. The area of the body that has peripheral edema will look swollen. It may also feel heavy or warm. Your clothes may start to feel tight. Pressing on the area may make a temporary dent in your skin. You may not be able to move your swollen arm or leg as much as usual. There are many causes of peripheral edema. It can happen because of a complication of other conditions such as congestive heart failure, kidney disease, or a problem with your blood circulation. It also can be a side effect of certain medicines or because of an infection. It often happens to women during pregnancy. Sometimes, the cause is not known. Follow these instructions at home: Managing pain, stiffness, and swelling   Raise (elevate) your legs while you are sitting or lying down.  Move around often to prevent stiffness and to lessen swelling.  Do not sit or stand for long periods of time.  Wear support stockings as told by your health care provider. Medicines  Take over-the-counter and prescription medicines only as told by your health care provider.  Your health care provider may prescribe medicine to help your body get rid of excess water (diuretic). General instructions  Pay attention to any changes in your symptoms.  Follow instructions from your health care provider about limiting salt (sodium) in your diet. Sometimes, eating less salt may reduce swelling.  Moisturize skin daily to help prevent skin from cracking and draining.  Keep all follow-up visits as told by your health care provider. This is important. Contact a health care provider if you have:  A fever.  Edema that starts suddenly or is getting worse, especially if you are pregnant or have a medical condition.  Swelling in  only one leg.  Increased swelling, redness, or pain in one or both of your legs.  Drainage or sores at the area where you have edema. Get help right away if you:  Develop shortness of breath, especially when you are lying down.  Have pain in your chest or abdomen.  Feel weak.  Feel faint. Summary  Peripheral edema is swelling that is caused by a buildup of fluid. Peripheral edema most often affects the lower legs, ankles, and feet.  Move around often to prevent stiffness and to lessen swelling. Do not sit or stand for long periods of time.  Pay attention to any changes in your symptoms.  Contact a health care provider if you have edema that starts suddenly or is getting worse, especially if you are pregnant or have a medical condition.  Get help right away if you develop shortness of breath, especially when lying down. This information is not intended to replace advice given to you by your health care provider. Make sure you discuss any questions you have with your health care provider. Document Revised: 01/31/2018 Document Reviewed: 01/31/2018 Elsevier Patient Education  2020 [employer].

## 2020-05-20 NOTE — Progress Notes (Signed)
Acute Office Visit  Subjective:    Patient ID: Danny Ferguson, male    DOB: 07/28/1960, 59 y.o.   MRN: [id number]  Chief Complaint  Patient presents with  . Foot Swelling    Feet swollen x 2 days.     HPI Patient is in today with c/o bilateral feet swelling x 2 days. He has a history of HTN and recently started taking amlodipine 10 mg daily. He has also noticed that his blood pressure is not as well controlled. Previously reports taking 2, Lisinopril HCT 20/12.5 daily that controlled his blood pressure well.   Past Medical History:  Diagnosis Date  . Chronic kidney disease   . Diabetes mellitus without complication (HCC)   . Heart murmur   . Hypertension     Past Surgical History:  Procedure Laterality Date  . NO PAST SURGERIES      Family History  Problem Relation Age of Onset  . Diabetes Mother   . Hypertension Mother   . Fibromyalgia Mother   . Hypertension Father   . Cancer Father        Prostate  . Hyperlipidemia Father   . Cancer Maternal Grandmother        Breast  . Diabetes Maternal Grandmother   . Hypertension Maternal Grandmother   . Factor V Leiden deficiency Maternal Grandmother   . Alzheimer's disease Maternal Grandfather   . Mental illness Son 80       Bipolar  . Depression Son   . Cancer Sister        breast cancer (has 2 sisters with Factor V Def; unsure which ones)  . Alzheimer's disease Paternal Grandmother   . Hypertension Paternal Grandmother   . Cancer Paternal Grandfather        Bone & Prostate  . Diabetes Paternal Grandfather   . Hyperlipidemia Paternal Grandfather   . Hypertension Paternal Grandfather   . Glaucoma Paternal Grandfather     Social History   Socioeconomic History  . Marital status: Married    Spouse name: Anitra  . Number of children: 1  . Years of education: Not on file  . Highest education level: Master's degree (e.g., MA, MS, MEng, MEd, MSW, MBA)  Occupational History  . Occupation: unemployed  Tobacco Use   . Smoking status: Never Smoker  . Smokeless tobacco: Never Used  Vaping Use  . Vaping Use: Never used  Substance and Sexual Activity  . Alcohol use: No    Alcohol/week: 0.0 standard drinks  . Drug use: No  . Sexual activity: Not on file  Other Topics Concern  . Not on file  Social History Narrative   Lives with his wife, his son who was diagnosed with bipolar disorder.   His father lives in [place].   Sisters: [place], [place], [place], [place]   Social Determinants of Health   Financial Resource Strain: Not on file  Food Insecurity: Not on file  Transportation Needs: Not on file  Physical Activity: Not on file  Stress: Not on file  Social Connections: Not on file  Intimate Partner Violence: Not on file    Outpatient Medications Prior to Visit  Medication Sig Dispense Refill  . amLODipine (NORVASC) 10 MG tablet Take 1 tablet (10 mg total) by mouth daily. 90 tablet 3  . metFORMIN (GLUCOPHAGE) 1000 MG tablet Take 1 tablet (1,000 mg total) by mouth 2 (two) times daily with a meal. 180 tablet 3  . pioglitazone (ACTOS) 15 MG tablet Take 1 tablet (15  mg total) by mouth daily. 90 tablet 3  . lisinopril-hydrochlorothiazide (ZESTORETIC) 20-25 MG tablet Take 1 tablet by mouth daily. 90 tablet 3   No facility-administered medications prior to visit.    Allergies  Allergen Reactions  . Penicillins Nausea And Vomiting    Review of Systems  Constitutional: Negative.   Respiratory: Negative.  Negative for shortness of breath and wheezing.   Cardiovascular: Positive for leg swelling. Negative for chest pain and palpitations.  Gastrointestinal: Negative.   Endocrine: Negative.   Genitourinary: Negative.   Musculoskeletal: Negative.   Skin: Negative.   Neurological: Negative.   Psychiatric/Behavioral: Negative.        Objective:    Physical Exam Constitutional:      Appearance: Normal appearance.  Cardiovascular:     Rate and Rhythm: Normal rate and regular rhythm.     Pulses:  Normal pulses.     Heart sounds: Normal heart sounds.  Pulmonary:     Effort: Pulmonary effort is normal.     Breath sounds: Normal breath sounds.  Abdominal:     General: Abdomen is flat.     Palpations: Abdomen is soft.  Musculoskeletal:        General: Swelling present. No tenderness.     Cervical back: Normal range of motion and neck supple.     Right lower leg: Edema present.     Left lower leg: Edema present.  Skin:    General: Skin is warm and dry.  Neurological:     Mental Status: He is alert.  Psychiatric:        Mood and Affect: Mood normal.        Behavior: Behavior normal.     BP (!) 142/80   Pulse 86   Temp 97.7 F (36.5 C) (Temporal)   Ht 5\' 10"  (1.778 m)   Wt 199 lb (90.3 kg)   SpO2 97%   BMI 28.55 kg/m  Wt Readings from Last 3 Encounters:  05/20/20 199 lb (90.3 kg)  04/22/20 198 lb 9.6 oz (90.1 kg)  11/08/19 195 lb 9.6 oz (88.7 kg)    Health Maintenance Due  Topic Date Due  . OPHTHALMOLOGY EXAM  06/24/2017  . FOOT EXAM  04/01/2020    There are no preventive care reminders to display for this patient.   Lab Results  Component Value Date   TSH 1.590 04/25/2017   Lab Results  Component Value Date   WBC 7.9 04/22/2020   HGB 15.2 04/22/2020   HCT 45.7 04/22/2020   MCV 84.8 04/22/2020   PLT 288.0 04/22/2020   Lab Results  Component Value Date   NA 139 04/22/2020   K 4.0 04/22/2020   CO2 29 04/22/2020   GLUCOSE 140 (H) 04/22/2020   BUN 16 04/22/2020   CREATININE 1.16 04/22/2020   BILITOT 1.2 04/22/2020   ALKPHOS 87 04/22/2020   AST 17 04/22/2020   ALT 22 04/22/2020   PROT 7.0 04/22/2020   ALBUMIN 4.5 04/22/2020   CALCIUM 9.3 04/22/2020   GFR 69.17 04/22/2020   Lab Results  Component Value Date   CHOL 151 04/22/2020   Lab Results  Component Value Date   HDL 59.80 04/22/2020   Lab Results  Component Value Date   LDLCALC 80 04/22/2020   Lab Results  Component Value Date   TRIG 54.0 04/22/2020   Lab Results  Component  Value Date   CHOLHDL 3 04/22/2020   Lab Results  Component Value Date   HGBA1C 8.1 (H) 04/22/2020  Assessment & Plan:   Problem List Items Addressed This Visit   None   Visit Diagnoses    Peripheral edema    -  Primary       Meds ordered this encounter  Medications  . lisinopril-hydrochlorothiazide (ZESTORETIC) 20-12.5 MG tablet    Sig: Take 2 tablets by mouth daily.    Dispense:  180 tablet    Refill:  1   Discontinue Amlodipine 10 mg Call the office with any questions or concerns. Send in blood pressure readings. Return if no better.  Eulis Foster, FNP

## 2020-05-21 ENCOUNTER — Other Ambulatory Visit: Payer: Self-pay | Admitting: Family

## 2020-06-18 DIAGNOSIS — N401 Enlarged prostate with lower urinary tract symptoms: Secondary | ICD-10-CM | POA: Diagnosis not present

## 2020-06-18 DIAGNOSIS — R3912 Poor urinary stream: Secondary | ICD-10-CM | POA: Diagnosis not present

## 2020-06-18 DIAGNOSIS — R972 Elevated prostate specific antigen [PSA]: Secondary | ICD-10-CM | POA: Diagnosis not present

## 2020-06-18 DIAGNOSIS — Z8042 Family history of malignant neoplasm of prostate: Secondary | ICD-10-CM | POA: Diagnosis not present

## 2020-06-18 LAB — PSA: PSA: 3.85

## 2020-06-23 ENCOUNTER — Other Ambulatory Visit: Payer: Self-pay

## 2020-06-24 ENCOUNTER — Encounter: Payer: Self-pay | Admitting: Family Medicine

## 2020-06-24 ENCOUNTER — Ambulatory Visit (INDEPENDENT_AMBULATORY_CARE_PROVIDER_SITE_OTHER): Payer: BC Managed Care – PPO

## 2020-06-24 DIAGNOSIS — Z23 Encounter for immunization: Secondary | ICD-10-CM | POA: Diagnosis not present

## 2020-06-24 NOTE — Progress Notes (Signed)
Per orders of Dr. Barron Alvine, pt received second dose of Shingrix vaccine 0.22mL's pt tolerated well

## 2020-12-03 ENCOUNTER — Telehealth: Payer: Self-pay | Admitting: Family Medicine

## 2020-12-03 NOTE — Telephone Encounter (Signed)
I scheduled an appointment for pt.

## 2020-12-03 NOTE — Telephone Encounter (Signed)
Called pt per email I received: Subject: A1C (SECURE)  Hello, my name is Jade and I am the quality coordinator assigned to [employer] at [facility] and I have a patient that fell on my gap report and needs to have his A1C checked and would like to have it done in office. It has been since 2021 since it has been checked. The patient declined to have a kit sent out to him and asks that an in office appointment be scheduled for this month or next month on a Friday mid-day. His work schedule only allows for him to be available on Fridays.   Claudean Severance DOB: 12/03/1960  Thank you so much for your help, I look forward to working closely together to get patients the care they need!  Have a great day! ------------------------------------------------------------------------------------------------------------------------------------------------------------------------------------------------------------------------------------------------------------------------------------------------------- Lvm for pt to call back to schedule appt

## 2020-12-17 ENCOUNTER — Other Ambulatory Visit: Payer: Self-pay

## 2020-12-18 ENCOUNTER — Ambulatory Visit: Payer: BC Managed Care – PPO | Admitting: Family Medicine

## 2020-12-18 ENCOUNTER — Encounter: Payer: Self-pay | Admitting: Family Medicine

## 2020-12-18 ENCOUNTER — Other Ambulatory Visit: Payer: Self-pay | Admitting: Family Medicine

## 2020-12-18 VITALS — BP 138/82 | HR 62 | Temp 98.2°F | Wt 189.0 lb

## 2020-12-18 DIAGNOSIS — I10 Essential (primary) hypertension: Secondary | ICD-10-CM

## 2020-12-18 DIAGNOSIS — R972 Elevated prostate specific antigen [PSA]: Secondary | ICD-10-CM | POA: Diagnosis not present

## 2020-12-18 DIAGNOSIS — E119 Type 2 diabetes mellitus without complications: Secondary | ICD-10-CM | POA: Diagnosis not present

## 2020-12-18 LAB — PSA: PSA: 9.44 ng/mL — ABNORMAL HIGH (ref 0.10–4.00)

## 2020-12-18 LAB — HEMOGLOBIN A1C: Hgb A1c MFr Bld: 7.5 % — ABNORMAL HIGH (ref 4.6–6.5)

## 2020-12-18 MED ORDER — LISINOPRIL-HYDROCHLOROTHIAZIDE 20-12.5 MG PO TABS: 2.0000 | ORAL_TABLET | Freq: Every day | ORAL | 1 refills | Status: DC

## 2020-12-18 MED ORDER — PIOGLITAZONE HCL 30 MG PO TABS: 30.0000 mg | ORAL_TABLET | Freq: Every day | ORAL | 1 refills | Status: DC

## 2020-12-18 MED ORDER — METFORMIN HCL 1000 MG PO TABS: 1000.0000 mg | ORAL_TABLET | Freq: Two times a day (BID) | ORAL | 1 refills | Status: DC

## 2020-12-18 NOTE — Progress Notes (Signed)
Chief Complaint  Patient presents with   Annual Exam   Follow-up    A1c and PSA. Med recheck/refills    HPI: *Danny Ferguson is a 60 y.o. male here for DM, HTN follow-up. For HTN pt is taking lisinopril-HCTZ 20-12.5 2 tabs po daily. Pt had LE edema with norvasc and is no longer taking.  For DM pt is taking metformin 1000mg  BID and actos 15mg  daily.   Pt does not check BS at home. Readings: n/a Hypoglycemia/Hypergylcemic episodes: n/a  Diet: improved compared to last OV 6-64mo ago. Pt has lost 10lbs since 04/2020 Exercise: no regular CV exercise   Lab Results  Component Value Date   HGBA1C 8.1 (H) 04/22/2020   Lab Results  Component Value Date   MICROALBUR 0.8 04/22/2020   Lab Results  Component Value Date   NA 139 04/22/2020   K 4.0 04/22/2020   CREATININE 1.16 04/22/2020   GFRNONAA 65 04/25/2017   GFRAA 75 04/25/2017   GLUCOSE 140 (H) 04/22/2020   Lab Results  Component Value Date   CHOL 151 04/22/2020   HDL 59.80 04/22/2020   LDLCALC 80 04/22/2020   TRIG 54.0 04/22/2020   CHOLHDL 3 04/22/2020    The 10-year ASCVD risk score 14/05/2019 DC Jr., et al., 2013) is: 13.2%   Values used to calculate the score:     Age: 71 years     Sex: Male     Is Non-Hispanic African American: No     Diabetic: Yes     Tobacco smoker: No     Systolic Blood Pressure: 138 mmHg     Is BP treated: Yes     HDL Cholesterol: 59.8 mg/dL     Total Cholesterol: 151 mg/dL  Pt also has a h/o elevated PSA and follows with [facility]. 2014. Last PSA = 3.85 in 05/2020  Past Medical History:  Diagnosis Date   Chronic kidney disease    Diabetes mellitus without complication (HCC)    Heart murmur    Hypertension     Past Surgical History:  Procedure Laterality Date   NO PAST SURGERIES      Social History   Socioeconomic History   Marital status: Married    Spouse name: Sande Brothers   Number of children: 1   Years of education: Not on file   Highest education level:  Master's degree (e.g., MA, MS, MEng, MEd, MSW, MBA)  Occupational History   Occupation: unemployed  Tobacco Use   Smoking status: Never   Smokeless tobacco: Never  Vaping Use   Vaping Use: Never used  Substance and Sexual Activity   Alcohol use: No    Alcohol/week: 0.0 standard drinks   Drug use: No   Sexual activity: Not on file  Other Topics Concern   Not on file  Social History Narrative   Lives with his wife, his son who was diagnosed with bipolar disorder.   His father lives in [place].   Sisters: Database administrator, [place], [place], [place]   Social Determinants of Health   Financial Resource Strain: Not on file  Food Insecurity: Not on file  Transportation Needs: Not on file  Physical Activity: Not on file  Stress: Not on file  Social Connections: Not on file  Intimate Partner Violence: Not on file    Family History  Problem Relation Age of Onset   Diabetes Mother    Hypertension Mother    Fibromyalgia Mother    Hypertension Father    Cancer Father  Prostate   Hyperlipidemia Father    Cancer Maternal Grandmother        Breast   Diabetes Maternal Grandmother    Hypertension Maternal Grandmother    Factor V Leiden deficiency Maternal Grandmother    Alzheimer's disease Maternal Grandfather    Mental illness Son 50       Bipolar   Depression Son    Cancer Sister        breast cancer (has 2 sisters with Factor V Def; unsure which ones)   Alzheimer's disease Paternal Grandmother    Hypertension Paternal Grandmother    Cancer Paternal Grandfather        Bone & Prostate   Diabetes Paternal Grandfather    Hyperlipidemia Paternal Grandfather    Hypertension Paternal Grandfather    Glaucoma Paternal Grandfather      Immunization History  Administered Date(s) Administered   Influenza,inj,Quad PF,6+ Mos 04/25/2017, 02/28/2018, 02/15/2019, 04/22/2020   Moderna Sars-Covid-2 Vaccination 08/15/2019, 09/12/2019, 04/07/2020   Pneumococcal Polysaccharide-23 05/31/2017   Tdap  04/02/2019   Zoster Recombinat (Shingrix) 04/22/2020, 06/24/2020    Outpatient Encounter Medications as of 12/18/2020  Medication Sig   pioglitazone (ACTOS) 15 MG tablet Take 1 tablet (15 mg total) by mouth daily.   [DISCONTINUED] lisinopril-hydrochlorothiazide (ZESTORETIC) 20-12.5 MG tablet Take 2 tablets by mouth daily.   [DISCONTINUED] metFORMIN (GLUCOPHAGE) 1000 MG tablet Take 1 tablet (1,000 mg total) by mouth 2 (two) times daily with a meal.   lisinopril-hydrochlorothiazide (ZESTORETIC) 20-12.5 MG tablet Take 2 tablets by mouth daily.   metFORMIN (GLUCOPHAGE) 1000 MG tablet Take 1 tablet (1,000 mg total) by mouth 2 (two) times daily with a meal.   No facility-administered encounter medications on file as of 12/18/2020.     ROS: Pertinent positives and negatives noted in HPI. Remainder of ROS non-contributory   Allergies  Allergen Reactions   Penicillins Nausea And Vomiting    BP 138/82 (BP Location: Left Arm, Patient Position: Sitting, Cuff Size: Normal)   Pulse 62   Temp 98.2 F (36.8 C) (Temporal)   Wt 189 lb (85.7 kg)   SpO2 97%   BMI 27.12 kg/m  Wt Readings from Last 3 Encounters:  12/18/20 189 lb (85.7 kg)  05/20/20 199 lb (90.3 kg)  04/22/20 198 lb 9.6 oz (90.1 kg)   Temp Readings from Last 3 Encounters:  12/18/20 98.2 F (36.8 C) (Temporal)  05/20/20 97.7 F (36.5 C) (Temporal)  04/22/20 (!) 97.2 F (36.2 C) (Temporal)   BP Readings from Last 3 Encounters:  12/18/20 138/82  05/20/20 (!) 142/80  04/22/20 (!) 158/86   Pulse Readings from Last 3 Encounters:  12/18/20 62  05/20/20 86  04/22/20 61     Physical Exam Constitutional:      General: He is not in acute distress.    Appearance: He is not ill-appearing.  Cardiovascular:     Rate and Rhythm: Normal rate and regular rhythm.     Pulses: Normal pulses.  Pulmonary:     Effort: Pulmonary effort is normal.     Breath sounds: Normal breath sounds. No wheezing or rhonchi.  Musculoskeletal:      Right lower leg: No edema.     Left lower leg: No edema.  Neurological:     Mental Status: He is alert and oriented to person, place, and time.  Psychiatric:        Mood and Affect: Mood normal.        Behavior: Behavior normal.     A/P:  1. Type 2 diabetes mellitus without complication, without long-term current use of insulin (HCC) - not checking BS at home, pt has lost 10lbs since last OV - Hemoglobin A1c - pt is taking actos 15mg  daily - may need to increase depending on A1C result today Refill: - metFORMIN (GLUCOPHAGE) 1000 MG tablet; Take 1 tablet (1,000 mg total) by mouth 2 (two) times daily with a meal.  Dispense: 180 tablet; Refill:  - would benefit from statin based on ASCVD score but LDL = 80 and pt declines - f/u in 3 mo with new PCP  2. Benign essential HTN - controlled, at goal Refill: - lisinopril-hydrochlorothiazide (ZESTORETIC) 20-12.5 MG tablet; Take 2 tablets by mouth daily.  Dispense: 180 tablet; Refill: 1  3. Elevated PSA, less than 10 ng/ml - last PSA = 3.85 in 05/2020 - PSA - fax result to Dr. 06/2020 at [facility]   This visit occurred during the SARS-CoV-2 public health emergency.  Safety protocols were in place, including screening questions prior to the visit, additional usage of staff PPE, and extensive cleaning of exam room while observing appropriate contact time as indicated for disinfecting solutions.

## 2021-02-18 DIAGNOSIS — E119 Type 2 diabetes mellitus without complications: Secondary | ICD-10-CM | POA: Diagnosis not present

## 2021-03-25 ENCOUNTER — Encounter: Payer: Self-pay | Admitting: Family Medicine

## 2021-03-26 ENCOUNTER — Encounter: Payer: Self-pay | Admitting: Family Medicine

## 2021-03-26 ENCOUNTER — Ambulatory Visit: Payer: BC Managed Care – PPO | Admitting: Family Medicine

## 2021-03-26 ENCOUNTER — Other Ambulatory Visit: Payer: Self-pay

## 2021-03-26 VITALS — BP 160/84 | HR 78 | Temp 98.0°F | Ht 70.0 in | Wt 195.0 lb

## 2021-03-26 DIAGNOSIS — N401 Enlarged prostate with lower urinary tract symptoms: Secondary | ICD-10-CM | POA: Diagnosis not present

## 2021-03-26 DIAGNOSIS — G8929 Other chronic pain: Secondary | ICD-10-CM

## 2021-03-26 DIAGNOSIS — R972 Elevated prostate specific antigen [PSA]: Secondary | ICD-10-CM

## 2021-03-26 DIAGNOSIS — N183 Chronic kidney disease, stage 3 unspecified: Secondary | ICD-10-CM

## 2021-03-26 DIAGNOSIS — E119 Type 2 diabetes mellitus without complications: Secondary | ICD-10-CM

## 2021-03-26 DIAGNOSIS — I10 Essential (primary) hypertension: Secondary | ICD-10-CM | POA: Diagnosis not present

## 2021-03-26 DIAGNOSIS — M5441 Lumbago with sciatica, right side: Secondary | ICD-10-CM

## 2021-03-26 DIAGNOSIS — R3912 Poor urinary stream: Secondary | ICD-10-CM | POA: Diagnosis not present

## 2021-03-26 DIAGNOSIS — Z23 Encounter for immunization: Secondary | ICD-10-CM | POA: Diagnosis not present

## 2021-03-26 LAB — MICROALBUMIN / CREATININE URINE RATIO
Creatinine,U: 137.3 mg/dL
Microalb Creat Ratio: 1.9 mg/g (ref 0.0–30.0)
Microalb, Ur: 2.6 mg/dL — ABNORMAL HIGH (ref 0.0–1.9)

## 2021-03-26 LAB — URINALYSIS, ROUTINE W REFLEX MICROSCOPIC
Bilirubin Urine: NEGATIVE
Ketones, ur: NEGATIVE
Leukocytes,Ua: NEGATIVE
Nitrite: NEGATIVE
Specific Gravity, Urine: >=1.03 — AB (ref 1.000–1.030)
Total Protein, Urine: NEGATIVE
Urine Glucose: 500 — AB
Urobilinogen, UA: 0.2 (ref 0.0–1.0)
pH: 5.5 (ref 5.0–8.0)

## 2021-03-26 LAB — BASIC METABOLIC PANEL
BUN: 18 mg/dL (ref 6–23)
CO2: 29 mEq/L (ref 19–32)
Calcium: 9.5 mg/dL (ref 8.4–10.5)
Chloride: 102 mEq/L (ref 96–112)
Creatinine, Ser: 1.26 mg/dL (ref 0.40–1.50)
GFR: 62.23 mL/min (ref >60.00)
Glucose, Bld: 174 mg/dL — ABNORMAL HIGH (ref 70–99)
Potassium: 3.7 mEq/L (ref 3.5–5.1)
Sodium: 140 mEq/L (ref 135–145)

## 2021-03-26 LAB — LIPID PANEL
Cholesterol: 125 mg/dL (ref 0–200)
HDL: 47.9 mg/dL (ref >39.00)
LDL Cholesterol: 67 mg/dL (ref 0–99)
NonHDL: 77.25
Total CHOL/HDL Ratio: 3
Triglycerides: 53 mg/dL (ref 0.0–149.0)
VLDL: 10.6 mg/dL (ref 0.0–40.0)

## 2021-03-26 LAB — HEMOGLOBIN A1C: Hgb A1c MFr Bld: 9.5 % — ABNORMAL HIGH (ref 4.6–6.5)

## 2021-03-26 MED ORDER — SITAGLIPTIN PHOSPHATE 100 MG PO TABS: 100.0000 mg | ORAL_TABLET | Freq: Every day | ORAL | 5 refills | Status: DC

## 2021-03-26 NOTE — Progress Notes (Signed)
[facility] PRIMARY CARE [facility] [address] [place] [address] Dept: [phone] Dept Fax: [phone]  Transfer of Care Office Visit  Subjective:    Patient ID: Danny Ferguson, male    DOB: 1961/02/03, 60 y.o..   MRN: [id number]  Chief Complaint  Patient presents with   Establish Care    [facility]- establish care.  Fasting today.   Wants flu shot today.      History of Present Illness:  Patient is in today to establish care. Danny Ferguson was born in [place], [place]. He has moved around a bit in his life. He is currently going through his 2nd divorce. He has a 46 year-old son who lives in [place], [place]. Danny Ferguson does work at the [employer] He denies tobacco or drug use. He rarely drinks alcohol.  Danny Ferguson has a history of Type 2 diabetes. He is currently managed on metformin. He had been taking pioglitazone, but felt it was causing him to urinate more frequently and he associated this with pain in his prostate area. He notes he recently saw his eye doctor for his annual exam.  Danny Ferguson has a history of hypertension. He was previously treated with amlodipine, but developed pedal edema from this. He was switched last winter to Zestoretic. He notes that he had an issue in the past with chronic kidney disease, but that he lost significant weight and this allowed the kidney issue to improve. He notes his blood pressures tend to run higher when he is at the doctor's office. He has checked these outside of the clinic and notes they tend to be ~ 138/90.  Danny Ferguson has a history of an elevated PSA. This tends to fluctuate up and down. He has had prior prostate biopsies, all of which were normal. He is currently following with Dr. Sande Brothers [facility]).  Danny Ferguson notes an issue with intermittent right lower back pain. This has been increasingly an issue over the past year. He denies prior back injury. He notes that sitting in  his chair at work "is killing me".  The pain does radiate down the right buttocks/leg at times. He occasionally has done some stretches, which temporarily helps.  He denies any areas of numbness/tingling in the legs or weakness.  Past Medical History: Patient Active Problem List   Diagnosis Date Noted   Elevated PSA, less than 10 ng/ml 05/15/2017   Diabetes mellitus type 2, uncomplicated (HCC) 04/25/2017   Essential hypertension 04/25/2017   CKD (chronic kidney disease) 04/25/2017   Past Surgical History:  Procedure Laterality Date   NO PAST SURGERIES     Family History  Problem Relation Age of Onset   Cancer Mother        Ovarian   Diabetes Mother    Hypertension Mother    Fibromyalgia Mother    Hypertension Father    Cancer Father        Prostate   Hyperlipidemia Father    Cancer Sister        breast cancer (has 2 sisters with Factor V Def; unsure which ones)   Mental illness Son 77       Bipolar   Depression Son    Heart disease Maternal Uncle    Cancer Maternal Grandmother        Breast   Diabetes Maternal Grandmother    Hypertension Maternal Grandmother    Factor V Leiden deficiency Maternal Grandmother    Alzheimer's disease Maternal  Grandfather    Alzheimer's disease Paternal Grandmother    Hypertension Paternal Grandmother    Cancer Paternal Grandfather        Bone & Prostate   Diabetes Paternal Grandfather    Hyperlipidemia Paternal Grandfather    Hypertension Paternal Grandfather    Glaucoma Paternal Grandfather    Outpatient Medications Prior to Visit  Medication Sig Dispense Refill   cetirizine (ZYRTEC) 10 MG tablet Take 10 mg by mouth daily.     lisinopril-hydrochlorothiazide (ZESTORETIC) 20-12.5 MG tablet Take 2 tablets by mouth daily. 180 tablet 1   metFORMIN (GLUCOPHAGE) 1000 MG tablet Take 1 tablet (1,000 mg total) by mouth 2 (two) times daily with a meal. 180 tablet 1   pioglitazone (ACTOS) 30 MG tablet Take 1 tablet (30 mg total) by mouth daily.  (Patient not taking: Reported on 03/26/2021) 90 tablet 1   No facility-administered medications prior to visit.   Allergies  Allergen Reactions   Penicillins Nausea And Vomiting   Objective:   Today's Vitals   03/26/21 0817 03/26/21 0820  BP: (!) 160/86 (!) 160/84  Pulse: 78   Temp: 98 F (36.7 C)   TempSrc: Temporal   SpO2: 98%   Weight: 195 lb (88.5 kg)   Height: 5\' 10"  (1.778 m)    Body mass index is 27.98 kg/m.   General: Well developed, well nourished. No acute distress. Back: Straight. Mildly tender over right paralumbar muscles. No swelling noted. Extremities: Full ROM. Strength 5/5. Sensation normal. DTR 2+ bilaterally. Feet: Skin intact. Pulses 2+. Normal hair growth on feet. 5.07 monofilament testing mostly normal (? decreased sensation   at heels). Psych: Alert and oriented. Normal mood and affect.  Health Maintenance Due  Topic Date Due   OPHTHALMOLOGY EXAM  06/24/2017   Pneumococcal Vaccine 99-31 Years old (2 - PCV) 05/31/2018   FOOT EXAM  04/01/2020   COVID-19 Vaccine (4 - Booster for Moderna series) 06/02/2020   INFLUENZA VACCINE  12/21/2020     Assessment & Plan:   1. Type 2 diabetes mellitus without complication, without long-term current use of insulin (HCC) We will check annual DM labs to assess current control off of pioglitazone. If his A1c remains elevated, will consider addition of a GLP-1 or SGLT2-I to improve control.  - Lipid panel - Microalbumin / creatinine urine ratio - Basic metabolic panel - Hemoglobin A1c - Urinalysis, Routine w reflex microscopic  2. Essential hypertension Blood pressure is high today. We discussed that he may need an additional blood pressure medication for adequate control. I will check his renal function, as this would help inform the target blood pressure.  3. Stage 3 chronic kidney disease, unspecified whether stage 3a or 3b CKD (HCC) Review of prior labs does not confirm that CKD is currently present. I will  recheck labs.  4. Elevated PSA, less than 10 ng/ml Danny Ferguson will continue to follow with Urology.  5. Need for influenza vaccination  - Flu Vaccine QUAD 6+ mos PF IM (Fluarix Quad PF)  6. Chronic right-sided low back pain with right-sided sciatica Exam is consistent with muscular low back pain. We discussed his need to get up and move more (sit less).  He has performed home back exercises previously. I recommended he go back to this. If not improving, I would consider a referral for PT.  Dorien Chihuahua, MD

## 2021-03-26 NOTE — Addendum Note (Signed)
Addended by: Loyola Mast on: 03/26/2021 04:06 PM   Modules accepted: Orders

## 2021-03-26 NOTE — Patient Instructions (Signed)

## 2021-04-09 ENCOUNTER — Telehealth: Payer: Self-pay

## 2021-04-09 DIAGNOSIS — R972 Elevated prostate specific antigen [PSA]: Secondary | ICD-10-CM | POA: Diagnosis not present

## 2021-04-09 DIAGNOSIS — R311 Benign essential microscopic hematuria: Secondary | ICD-10-CM | POA: Diagnosis not present

## 2021-04-09 DIAGNOSIS — E119 Type 2 diabetes mellitus without complications: Secondary | ICD-10-CM

## 2021-04-09 DIAGNOSIS — R3912 Poor urinary stream: Secondary | ICD-10-CM | POA: Diagnosis not present

## 2021-04-09 DIAGNOSIS — N401 Enlarged prostate with lower urinary tract symptoms: Secondary | ICD-10-CM | POA: Diagnosis not present

## 2021-04-09 MED ORDER — RYBELSUS 7 MG PO TABS
7.0000 mg | ORAL_TABLET | Freq: Every day | ORAL | 3 refills | Status: DC
Start: 2021-04-09 — End: 2021-09-24

## 2021-04-09 MED ORDER — RYBELSUS 3 MG PO TABS: 3.0000 mg | ORAL_TABLET | Freq: Every day | ORAL | 0 refills | Status: DC

## 2021-04-09 NOTE — Telephone Encounter (Signed)
Patient notified VIA phone and will check with the pharmacy.  Dm/cma

## 2021-04-09 NOTE — Telephone Encounter (Signed)
Pt called to see if there was a less expensive alternative for his  Januvia prescription. With insurance it will cost him 514.00.  His pharmacy is [facility] [address]

## 2021-04-13 ENCOUNTER — Encounter: Payer: Self-pay | Admitting: Family Medicine

## 2021-04-13 DIAGNOSIS — R311 Benign essential microscopic hematuria: Secondary | ICD-10-CM | POA: Insufficient documentation

## 2021-04-13 DIAGNOSIS — N401 Enlarged prostate with lower urinary tract symptoms: Secondary | ICD-10-CM | POA: Insufficient documentation

## 2021-04-19 ENCOUNTER — Telehealth: Payer: Self-pay

## 2021-04-19 NOTE — Telephone Encounter (Signed)
PA for Rybelsus 7 mg  submitted and approved. Dm/cma

## 2021-07-02 ENCOUNTER — Ambulatory Visit: Payer: BC Managed Care – PPO | Admitting: Family Medicine

## 2021-09-24 ENCOUNTER — Encounter: Payer: Self-pay | Admitting: Family Medicine

## 2021-09-24 ENCOUNTER — Ambulatory Visit: Payer: BC Managed Care – PPO | Admitting: Family Medicine

## 2021-09-24 VITALS — BP 158/86 | HR 79 | Temp 98.2°F | Ht 70.0 in | Wt 183.6 lb

## 2021-09-24 DIAGNOSIS — I10 Essential (primary) hypertension: Secondary | ICD-10-CM | POA: Diagnosis not present

## 2021-09-24 DIAGNOSIS — E119 Type 2 diabetes mellitus without complications: Secondary | ICD-10-CM

## 2021-09-24 MED ORDER — CHLORTHALIDONE 25 MG PO TABS: 25.0000 mg | ORAL_TABLET | Freq: Every day | ORAL | 3 refills | Status: DC

## 2021-09-24 MED ORDER — LISINOPRIL 20 MG PO TABS: 40.0000 mg | ORAL_TABLET | Freq: Every day | ORAL | 3 refills | Status: DC

## 2021-09-24 NOTE — Progress Notes (Signed)
? PRIMARY CARE ?[facility] ?[address] ?[place] [address] ?Dept: [phone] ?Dept Fax: [phone] ? ?Chronic Care Office Visit ? ?Subjective:  ? ? Patient ID: Danny Ferguson, male    DOB: 1960/10/01, 61 y.o..   MRN: [id number] ? ?Chief Complaint  ?Patient presents with  ? Follow-up  ?  C/o having elevate BP 155/112.   ? ? ?History of Present Illness: ? ?Patient is in today for reassessment of chronic medical issues. ? ?Mr. Coppens has a history of Type 2 diabetes. He is currently managed on metformin. He had been taking pioglitazone last summer, but felt it was causing him to urinate more frequently and he associated this with pain in his prostate area. Although his metformin had been prescribed bid, he has only been taking this once a day. In response to his last A1c being elevated, I had tried to start him on sitagliptin. His cost was going to be $514 a prescription, which he felt he could not afford. We were able to get pre-approval for semaglutide (Rybelsus), but he still felt his co-pay was too much. ?  ?Mr. Bendorf has a history of hypertension. He was previously treated with amlodipine, but developed pedal edema from this in Dec. 2021. He was switched last winter to Zestoretic. He notes that he had an issue in the past with chronic kidney disease, but that he lost significant weight and this allowed the kidney issue to improve. He was told he had an elevated blood pressure when he went for recent dental work. He notes his home blood pressures have been averaging 155/112. ?  ?Mr. Flaim has a history of an elevated PSA. This tends to fluctuate up and down. He has had prior prostate biopsies, all of which were normal. He is currently following with Dr. Sande Brothers [facility]). ? ?Past Medical History: ?Patient Active Problem List  ? Diagnosis Date Noted  ? Benign localized prostatic hyperplasia with lower urinary tract symptoms (LUTS) 04/13/2021  ? Benign  microscopic hematuria 04/13/2021  ? Elevated PSA, less than 10 ng/ml 05/15/2017  ? Diabetes mellitus type 2, uncomplicated (HCC) 04/25/2017  ? Essential hypertension 04/25/2017  ? ?Past Surgical History:  ?Procedure Laterality Date  ? NO PAST SURGERIES    ? ?Family History  ?Problem Relation Age of Onset  ? Cancer Mother   ?     Ovarian  ? Diabetes Mother   ? Hypertension Mother   ? Fibromyalgia Mother   ? Hypertension Father   ? Cancer Father   ?     Prostate  ? Hyperlipidemia Father   ? Cancer Sister   ?     breast cancer (has 2 sisters with Factor V Def; unsure which ones)  ? Mental illness Son 6  ?     Bipolar  ? Depression Son   ? Multiple sclerosis Son   ? Heart disease Maternal Uncle   ? Cancer Maternal Grandmother   ?     Breast  ? Diabetes Maternal Grandmother   ? Hypertension Maternal Grandmother   ? Factor V Leiden deficiency Maternal Grandmother   ? Alzheimer's disease Maternal Grandfather   ? Alzheimer's disease Paternal Grandmother   ? Hypertension Paternal Grandmother   ? Cancer Paternal Grandfather   ?     Bone & Prostate  ? Diabetes Paternal Grandfather   ? Hyperlipidemia Paternal Grandfather   ? Hypertension Paternal Grandfather   ? Glaucoma Paternal Grandfather   ? ?Outpatient Medications Prior to Visit  ?  Medication Sig Dispense Refill  ? cetirizine (ZYRTEC) 10 MG tablet Take 10 mg by mouth daily.    ? metFORMIN (GLUCOPHAGE) 1000 MG tablet Take 1 tablet (1,000 mg total) by mouth 2 (two) times daily with a meal. (Patient taking differently: Take 1,000 mg by mouth daily with breakfast.) 180 tablet 1  ? lisinopril-hydrochlorothiazide (ZESTORETIC) 20-12.5 MG tablet Take 2 tablets by mouth daily. 180 tablet 1  ? Semaglutide (RYBELSUS) 3 MG TABS Take 3 mg by mouth daily. (Patient not taking: Reported on 09/24/2021) 30 tablet 0  ? Semaglutide (RYBELSUS) 7 MG TABS Take 7 mg by mouth daily. To start after first 30 days on 3 mg dose. (Patient not taking: Reported on 09/24/2021) 30 tablet 3  ? sitaGLIPtin  (JANUVIA) 100 MG tablet Take 1 tablet (100 mg total) by mouth daily. 30 tablet 5  ? ?No facility-administered medications prior to visit.  ? ?Allergies  ?Allergen Reactions  ? Amlodipine Other (See Comments)  ?  Pedal edema  ? Penicillins Nausea And Vomiting  ?  ?Objective:  ? ?Today's Vitals  ? 09/24/21 1335  ?BP: (!) 158/86  ?Pulse: 79  ?Temp: 98.2 ?F (36.8 ?C)  ?TempSrc: Temporal  ?SpO2: 98%  ?Weight: 183 lb 9.6 oz (83.3 kg)  ?Height: 5\' 10"  (1.778 m)  ? ?Body mass index is 26.34 kg/m?.  ? ?General: Well developed, well nourished. No acute distress. ?Psych: Alert and oriented. Normal mood and affect. ? ?Health Maintenance Due  ?Topic Date Due  ? HEMOGLOBIN A1C  09/23/2021  ? ?Assessment & Plan:  ? ?1. Type 2 diabetes mellitus without complication, without long-term current use of insulin (HCC) ?Mr. Leatherwood has had 12 lb. of weight loss in the past 6 months. I am concerned this may be due to uncontrolled diabetes, since his last A1c was elevated and he actually reduced his meds, rather than increased them. I will await the result of his A1c. If above goal, I will at least have him increase his metformin to bid. I will consider adding glipizide to his regimen, as a medicien he may be able to afford. ? ?- Glucose, random ?- Hemoglobin A1c ? ?2. Essential hypertension ?Mr. Pennypacker's blood pressure is elevated. He was intolerant of a CCB, due to edema. I will try switching him to chlorthalidone to see if a more potent diuretic helps get him in control. I will recheck his BP in 1 month. ? ?- lisinopril (ZESTRIL) 20 MG tablet; Take 2 tablets (40 mg total) by mouth daily.  Dispense: 180 tablet; Refill: 3 ?- chlorthalidone (HYGROTON) 25 MG tablet; Take 1 tablet (25 mg total) by mouth daily.  Dispense: 90 tablet; Refill: 3 ? ? ?Return in about 4 weeks (around 10/22/2021) for Reassessment.  ? ?12/22/2021, MD ?

## 2021-09-25 LAB — GLUCOSE, RANDOM: Glucose: 190 mg/dL — ABNORMAL HIGH (ref 70–99)

## 2021-09-25 LAB — HEMOGLOBIN A1C
Est. average glucose Bld gHb Est-mCnc: 275 mg/dL
Hgb A1c MFr Bld: 11.2 % — ABNORMAL HIGH (ref 4.8–5.6)

## 2021-09-27 MED ORDER — GLIPIZIDE 5 MG PO TABS: 2.5000 mg | ORAL_TABLET | Freq: Two times a day (BID) | ORAL | 3 refills | Status: DC

## 2021-09-27 NOTE — Addendum Note (Signed)
Addended by: Loyola Mast on: 09/27/2021 08:15 AM ? ? Modules accepted: Orders ? ?

## 2021-10-08 DIAGNOSIS — N401 Enlarged prostate with lower urinary tract symptoms: Secondary | ICD-10-CM | POA: Diagnosis not present

## 2021-10-08 DIAGNOSIS — R3912 Poor urinary stream: Secondary | ICD-10-CM | POA: Diagnosis not present

## 2021-10-22 ENCOUNTER — Ambulatory Visit: Payer: BC Managed Care – PPO | Admitting: Family Medicine

## 2021-10-22 ENCOUNTER — Encounter: Payer: Self-pay | Admitting: Family Medicine

## 2021-10-22 VITALS — BP 150/90 | HR 80 | Temp 97.0°F | Wt 182.6 lb

## 2021-10-22 DIAGNOSIS — I10 Essential (primary) hypertension: Secondary | ICD-10-CM | POA: Diagnosis not present

## 2021-10-22 DIAGNOSIS — E119 Type 2 diabetes mellitus without complications: Secondary | ICD-10-CM

## 2021-10-22 MED ORDER — SPIRONOLACTONE 25 MG PO TABS: 25.0000 mg | ORAL_TABLET | Freq: Every day | ORAL | 3 refills | Status: DC

## 2021-10-22 MED ORDER — LISINOPRIL 40 MG PO TABS: 40.0000 mg | ORAL_TABLET | Freq: Every day | ORAL | 3 refills | Status: DC

## 2021-10-22 MED ORDER — METFORMIN HCL 1000 MG PO TABS: 1000.0000 mg | ORAL_TABLET | Freq: Two times a day (BID) | ORAL | 1 refills | Status: DC

## 2021-10-22 NOTE — Progress Notes (Signed)
[facility] [address] [place] [address] Dept: [phone] Dept Fax: [phone]  Office Visit  Subjective:    Patient ID: Danny Ferguson, male    DOB: 09/26/1960, 61 y.o..   MRN: [id number]  Chief Complaint  Patient presents with   Follow-up    4 week follow up blood pressure. Patient has b/p log on his cell phone. Patient is non-fasting.     History of Present Illness:  Patient is in today for reassessment of his blood pressure. Danny Ferguson has a history of hypertension. He was previously treated with amlodipine, but developed pedal edema. He was then tried on Zestoretic from this in Dec. 2021. He was switched last winter to Zestoretic. At his visit last month, his blood pressure remained high. We switched him to lisinopril 40 mg daily (two 20 mg tabs) and chlorthalidone 25 mg daily. He has been checking his blood pressure at various locations and recording the results. His average across these BPs is 149/90.  Danny Ferguson has a history of Type 2 diabetes. His last A1c had gone up considerably. Although his metformin had been prescribed bid, he has only been taking this once a day. He has had concerns about medication cost related to being prescribed sitagliptin and semaglutide. Base don his recent high A1c, I recommended he resume taking the metformin twice a day. I added glipizide 2.5 mg bid.  Past Medical History: Patient Active Problem List   Diagnosis Date Noted   Benign localized prostatic hyperplasia with lower urinary tract symptoms (LUTS) 04/13/2021   Benign microscopic hematuria 04/13/2021   Elevated PSA, less than 10 ng/ml 05/15/2017   Diabetes mellitus type 2, uncomplicated (HCC) 04/25/2017   Essential hypertension 04/25/2017   Past Surgical History:  Procedure Laterality Date   NO PAST SURGERIES     Family History  Problem Relation Age of Onset   Cancer Mother        Ovarian   Diabetes Mother     Hypertension Mother    Fibromyalgia Mother    Hypertension Father    Cancer Father        Prostate   Hyperlipidemia Father    Cancer Sister        breast cancer (has 2 sisters with Factor V Def; unsure which ones)   Mental illness Son 62       Bipolar   Depression Son    Multiple sclerosis Son    Heart disease Maternal Uncle    Cancer Maternal Grandmother        Breast   Diabetes Maternal Grandmother    Hypertension Maternal Grandmother    Factor V Leiden deficiency Maternal Grandmother    Alzheimer's disease Maternal Grandfather    Alzheimer's disease Paternal Grandmother    Hypertension Paternal Grandmother    Cancer Paternal Grandfather        Bone & Prostate   Diabetes Paternal Grandfather    Hyperlipidemia Paternal Grandfather    Hypertension Paternal Grandfather    Glaucoma Paternal Grandfather    Outpatient Medications Prior to Visit  Medication Sig Dispense Refill   cetirizine (ZYRTEC) 10 MG tablet Take 10 mg by mouth daily.     chlorthalidone (HYGROTON) 25 MG tablet Take 1 tablet (25 mg total) by mouth daily. 90 tablet 3   glipiZIDE (GLUCOTROL) 5 MG tablet Take 0.5 tablets (2.5 mg total) by mouth 2 (two) times daily before a meal. 60 tablet 3   lisinopril (ZESTRIL) 20 MG tablet  Take 2 tablets (40 mg total) by mouth daily. 180 tablet 3   metFORMIN (GLUCOPHAGE) 1000 MG tablet Take 1 tablet (1,000 mg total) by mouth 2 (two) times daily with a meal. (Patient taking differently: Take 1,000 mg by mouth daily with breakfast.) 180 tablet 1   No facility-administered medications prior to visit.   Allergies  Allergen Reactions   Amlodipine Other (See Comments)    Pedal edema   Penicillins Nausea And Vomiting     Objective:   Today's Vitals   10/22/21 0939  BP: (!) 150/90  Pulse: 80  Temp: (!) 97 F (36.1 C)  TempSrc: Temporal  SpO2: 98%  Weight: 182 lb 9.6 oz (82.8 kg)   Body mass index is 26.2 kg/m.   General: Well developed, well nourished. No acute  distress. Psych: Alert and oriented. Normal mood and affect.  Health Maintenance Due  Topic Date Due   COVID-19 Vaccine (4 - Booster for Moderna series) 06/02/2020   Lab Results Last hemoglobin A1c Lab Results  Component Value Date   HGBA1C 11.2 (H) 09/24/2021    Assessment & Plan:   1. Essential hypertension BP remains high. I will continue his lisinopril 40 mg daily and chlorthalidone 25 mg daily. I willa dd spironolactone 25 mg daily. He should continue to check his blood pressures at home. I asked him to send the results of these to me in one month.  - lisinopril (ZESTRIL) 40 MG tablet; Take 1 tablet (40 mg total) by mouth daily.  Dispense: 90 tablet; Refill: 3 - spironolactone (ALDACTONE) 25 MG tablet; Take 1 tablet (25 mg total) by mouth daily.  Dispense: 30 tablet; Refill: 3  2. Type 2 diabetes mellitus without complication, without long-term current use of insulin (HCC) Continue metformin 1000 mg bid and glipizide 2.5 mg bid. I will reassess this in 2 months.  - metFORMIN (GLUCOPHAGE) 1000 MG tablet; Take 1 tablet (1,000 mg total) by mouth 2 (two) times daily with a meal.  Dispense: 180 tablet; Refill: 1   Return in about 2 months (around 12/22/2021).   Loyola Mast, MD

## 2021-10-22 NOTE — Patient Instructions (Signed)
Send Dr. Veto Kemps your BP results in about a month via My Chart.

## 2021-11-25 ENCOUNTER — Encounter: Payer: Self-pay | Admitting: Family Medicine

## 2021-11-25 DIAGNOSIS — I10 Essential (primary) hypertension: Secondary | ICD-10-CM

## 2021-12-17 ENCOUNTER — Ambulatory Visit: Payer: BC Managed Care – PPO | Admitting: Cardiology

## 2021-12-17 ENCOUNTER — Encounter: Payer: Self-pay | Admitting: Cardiology

## 2021-12-17 VITALS — BP 120/72 | HR 73 | Ht 70.5 in | Wt 188.0 lb

## 2021-12-17 DIAGNOSIS — E119 Type 2 diabetes mellitus without complications: Secondary | ICD-10-CM | POA: Diagnosis not present

## 2021-12-17 DIAGNOSIS — I10 Essential (primary) hypertension: Secondary | ICD-10-CM | POA: Diagnosis not present

## 2021-12-17 MED ORDER — ROSUVASTATIN CALCIUM 5 MG PO TABS
5.0000 mg | ORAL_TABLET | Freq: Every day | ORAL | 3 refills | Status: DC
Start: 2021-12-17 — End: 2023-06-05

## 2021-12-17 MED ORDER — ASPIRIN 81 MG PO TBEC: 81.0000 mg | DELAYED_RELEASE_TABLET | Freq: Every day | ORAL | 3 refills | Status: AC

## 2021-12-17 NOTE — Progress Notes (Signed)
Cardiology Office Note:    Date:  12/17/2021   ID:  Danny Ferguson, DOB 06/11/1960, MRN [id number]  PCP:  Loyola Mast, MD   [facility] Providers Cardiologist:  Donato Schultz, MD     Referring MD: Loyola Mast, MD    History of Present Illness:    Danny Ferguson is a 61 y.o. male here for the evaluation of difficult to control hypertension at the request of Dr. Herbie Drape.  Started BP meds 2010.   Has diabetes hemoglobin A1c in 2020 was 6.8, recently has increased from 7.5-9 0.5-11.2.  Chronic kidney disease  Hypertension previously treated with: --Metoprolol - ED -Amlodipine, pedal edema -Zestoretic-blood pressure remained high  Currently taking: -Lisinopril 40 mg a day  -Chlorthalidone 25 mg a day-average blood pressure 149/90 -Spironolactone 25 mg a day  Blood pressure today in clinic 120/72.  Before had HA, ringing in ears.   Currently he is quite sedentary works at the [employer] in [place].  Overall no chest pain fever chills nausea vomiting syncope bleeding.  No shortness of breath no chest pain  Past Medical History:  Diagnosis Date   Chronic kidney disease    Diabetes mellitus without complication (HCC)    Heart murmur    Hypertension     Past Surgical History:  Procedure Laterality Date   NO PAST SURGERIES      Current Medications: Current Meds  Medication Sig   aspirin EC 81 MG tablet Take 1 tablet (81 mg total) by mouth daily. Swallow whole.   cetirizine (ZYRTEC) 10 MG tablet Take 10 mg by mouth daily.   chlorthalidone (HYGROTON) 25 MG tablet Take 1 tablet (25 mg total) by mouth daily.   glipiZIDE (GLUCOTROL) 5 MG tablet Take 0.5 tablets (2.5 mg total) by mouth 2 (two) times daily before a meal.   lisinopril (ZESTRIL) 40 MG tablet Take 1 tablet (40 mg total) by mouth daily.   metFORMIN (GLUCOPHAGE) 1000 MG tablet Take 1 tablet (1,000 mg total) by mouth 2 (two) times daily with a meal.   rosuvastatin (CRESTOR) 5  MG tablet Take 1 tablet (5 mg total) by mouth daily.   spironolactone (ALDACTONE) 25 MG tablet Take 1 tablet (25 mg total) by mouth daily.     Allergies:   Amlodipine and Penicillins   Social History   Socioeconomic History   Marital status: Married    Spouse name: Database administrator   Number of children: 1   Years of education: Not on file   Highest education level: Master's degree (e.g., MA, MS, MEng, MEd, MSW, MBA)  Occupational History   Occupation: unemployed  Tobacco Use   Smoking status: Never   Smokeless tobacco: Never  Vaping Use   Vaping Use: Never used  Substance and Sexual Activity   Alcohol use: No    Alcohol/week: 0.0 standard drinks of alcohol   Drug use: No   Sexual activity: Yes  Other Topics Concern   Not on file  Social History Narrative   Not on file   Social Determinants of Health   Financial Resource Strain: Not on file  Food Insecurity: Not on file  Transportation Needs: Not on file  Physical Activity: Not on file  Stress: Not on file  Social Connections: Not on file     Family History: The patient's family history includes Alzheimer's disease in his maternal grandfather and paternal grandmother; Cancer in his father, maternal grandmother, mother, paternal grandfather, and sister; Depression in his son; Diabetes  in his maternal grandmother, mother, and paternal grandfather; Factor V Leiden deficiency in his maternal grandmother; Fibromyalgia in his mother; Glaucoma in his paternal grandfather; Heart disease in his maternal uncle; Hyperlipidemia in his father and paternal grandfather; Hypertension in his father, maternal grandmother, mother, paternal grandfather, and paternal grandmother; Mental illness (age of onset: 45) in his son; Multiple sclerosis in his son.  ROS:   Please see the history of present illness.     All other systems reviewed and are negative.  EKGs/Labs/Other Studies Reviewed:    The following studies were reviewed today: Prior office  notes, EKG reviewed  EKG:  EKG is  ordered today.  The ekg ordered today demonstrates normal sinus rhythm 73 nonspecific ST-T wave changes  Recent Labs: 03/26/2021: BUN 18; Creatinine, Ser 1.26; Potassium 3.7; Sodium 140  Recent Lipid Panel    Component Value Date/Time   CHOL 125 03/26/2021 0909   CHOL 167 04/25/2017 0829   TRIG 53.0 03/26/2021 0909   HDL 47.90 03/26/2021 0909   HDL 57 04/25/2017 0829   CHOLHDL 3 03/26/2021 0909   VLDL 10.6 03/26/2021 0909   LDLCALC 67 03/26/2021 0909   LDLCALC 98 04/25/2017 0829     Risk Assessment/Calculations:              Physical Exam:    VS:  BP 120/72   Pulse 73   Ht 5' 10.5" (1.791 m)   Wt 188 lb (85.3 kg)   SpO2 98%   BMI 26.59 kg/m     Wt Readings from Last 3 Encounters:  12/17/21 188 lb (85.3 kg)  10/22/21 182 lb 9.6 oz (82.8 kg)  09/24/21 183 lb 9.6 oz (83.3 kg)     GEN:  Well nourished, well developed in no acute distress HEENT: Normal NECK: No JVD; No carotid bruits LYMPHATICS: No lymphadenopathy CARDIAC: RRR, no murmurs, no rubs, gallops RESPIRATORY:  Clear to auscultation without rales, wheezing or rhonchi  ABDOMEN: Soft, non-tender, non-distended MUSCULOSKELETAL:  No edema; No deformity  SKIN: Warm and dry NEUROLOGIC:  Alert and oriented x 3 PSYCHIATRIC:  Normal affect   ASSESSMENT:    1. Resistant hypertension   2. Diabetes mellitus with coincident hypertension (HCC)    PLAN:    In order of problems listed above:  Difficult to control hypertension - Currently, blood pressure looks excellent on lisinopril 40 mg a day, chlorthalidone 25 mg a day, spironolactone 25 mg a day.  Continue with current regimen.  This seems to be working well.  I do not feel strongly that we need to look for any secondary work-up at this time such as renal artery stenosis. -Prior intolerance to amlodipine, metoprolol  Diabetes with hypertension - I will go ahead and start him on Crestor 5 mg once a day, even though his LDL  is 67.  This will help reduce his overall risk of heart attack with his underlying diabetes.  I have also encouraged him to take a low-dose aspirin 81 mg as well. -He will continue to work on his blood sugars.  He has lost weight.  He is quite sedentary at his job         Medication Adjustments/Labs and Tests Ordered: Current medicines are reviewed at length with the patient today.  Concerns regarding medicines are outlined above.  Orders Placed This Encounter  Procedures   EKG 12-Lead   Meds ordered this encounter  Medications   aspirin EC 81 MG tablet    Sig: Take 1 tablet (81  mg total) by mouth daily. Swallow whole.    Dispense:  90 tablet    Refill:  3   rosuvastatin (CRESTOR) 5 MG tablet    Sig: Take 1 tablet (5 mg total) by mouth daily.    Dispense:  90 tablet    Refill:  3    Patient Instructions  Medication Instructions:  Please start Asprin 81 mg a day and Crestor (rosuvastatin) 5 mg a day. Continue all other medications as listed.  *If you need a refill on your cardiac medications before your next appointment, please call your pharmacy*   Follow-Up: At [facility], you and your health needs are our priority.  As part of our continuing mission to provide you with exceptional heart care, we have created designated Provider Care Teams.  These Care Teams include your primary Cardiologist (physician) and Advanced Practice Providers (APPs -  Physician Assistants and Nurse Practitioners) who all work together to provide you with the care you need, when you need it.  We recommend signing up for the patient portal called "MyChart".  Sign up information is provided on this After Visit Summary.  MyChart is used to connect with patients for Virtual Visits (Telemedicine).  Patients are able to view lab/test results, encounter notes, upcoming appointments, etc.  Non-urgent messages can be sent to your provider as well.   To learn more about what you can do with MyChart, go to  [email or web address].    Your next appointment:   1 year(s)  The format for your next appointment:   In Person  Provider:   Dr Donato Schultz   Important Information About Sugar         Signed, Donato Schultz, MD  12/17/2021 3:15 PM    [facility]

## 2021-12-17 NOTE — Patient Instructions (Signed)
Medication Instructions:  Please start Asprin 81 mg a day and Crestor (rosuvastatin) 5 mg a day. Continue all other medications as listed.  *If you need a refill on your cardiac medications before your next appointment, please call your pharmacy*   Follow-Up: At [facility], you and your health needs are our priority.  As part of our continuing mission to provide you with exceptional heart care, we have created designated Provider Care Teams.  These Care Teams include your primary Cardiologist (physician) and Advanced Practice Providers (APPs -  Physician Assistants and Nurse Practitioners) who all work together to provide you with the care you need, when you need it.  We recommend signing up for the patient portal called "MyChart".  Sign up information is provided on this After Visit Summary.  MyChart is used to connect with patients for Virtual Visits (Telemedicine).  Patients are able to view lab/test results, encounter notes, upcoming appointments, etc.  Non-urgent messages can be sent to your provider as well.   To learn more about what you can do with MyChart, go to [email or web address].    Your next appointment:   1 year(s)  The format for your next appointment:   In Person  Provider:   Dr Donato Schultz   Important Information About Sugar

## 2021-12-24 ENCOUNTER — Ambulatory Visit: Payer: BC Managed Care – PPO | Admitting: Family Medicine

## 2022-02-25 ENCOUNTER — Other Ambulatory Visit: Payer: Self-pay | Admitting: Family Medicine

## 2022-02-25 DIAGNOSIS — I10 Essential (primary) hypertension: Secondary | ICD-10-CM

## 2022-02-25 DIAGNOSIS — E119 Type 2 diabetes mellitus without complications: Secondary | ICD-10-CM

## 2022-03-08 ENCOUNTER — Other Ambulatory Visit: Payer: Self-pay

## 2022-03-15 ENCOUNTER — Other Ambulatory Visit: Payer: Self-pay

## 2022-03-15 DIAGNOSIS — E119 Type 2 diabetes mellitus without complications: Secondary | ICD-10-CM

## 2022-03-15 MED ORDER — METFORMIN HCL 1000 MG PO TABS: 1000.0000 mg | ORAL_TABLET | Freq: Two times a day (BID) | ORAL | 0 refills | Status: DC

## 2022-03-18 ENCOUNTER — Other Ambulatory Visit: Payer: Self-pay | Admitting: Family Medicine

## 2022-03-18 DIAGNOSIS — I10 Essential (primary) hypertension: Secondary | ICD-10-CM

## 2022-04-01 DIAGNOSIS — R972 Elevated prostate specific antigen [PSA]: Secondary | ICD-10-CM | POA: Diagnosis not present

## 2022-04-22 ENCOUNTER — Encounter: Payer: Self-pay | Admitting: Family Medicine

## 2022-04-22 ENCOUNTER — Ambulatory Visit: Payer: BC Managed Care – PPO | Admitting: Family Medicine

## 2022-04-22 VITALS — BP 124/76 | HR 73 | Temp 97.6°F | Ht 70.5 in | Wt 191.2 lb

## 2022-04-22 DIAGNOSIS — E119 Type 2 diabetes mellitus without complications: Secondary | ICD-10-CM | POA: Diagnosis not present

## 2022-04-22 DIAGNOSIS — R972 Elevated prostate specific antigen [PSA]: Secondary | ICD-10-CM | POA: Diagnosis not present

## 2022-04-22 DIAGNOSIS — Z0000 Encounter for general adult medical examination without abnormal findings: Secondary | ICD-10-CM | POA: Diagnosis not present

## 2022-04-22 DIAGNOSIS — Z23 Encounter for immunization: Secondary | ICD-10-CM | POA: Diagnosis not present

## 2022-04-22 DIAGNOSIS — I10 Essential (primary) hypertension: Secondary | ICD-10-CM | POA: Diagnosis not present

## 2022-04-22 LAB — URINALYSIS, ROUTINE W REFLEX MICROSCOPIC
Bilirubin Urine: NEGATIVE
Hgb urine dipstick: NEGATIVE
Leukocytes,Ua: NEGATIVE
Nitrite: NEGATIVE
RBC / HPF: NONE SEEN (ref 0–?)
Specific Gravity, Urine: >=1.03 — AB (ref 1.000–1.030)
Total Protein, Urine: NEGATIVE
Urine Glucose: >=1000 — AB
Urobilinogen, UA: 0.2 (ref 0.0–1.0)
pH: 5.5 (ref 5.0–8.0)

## 2022-04-22 LAB — LIPID PANEL
Cholesterol: 102 mg/dL (ref 0–200)
HDL: 55.2 mg/dL (ref >39.00)
LDL Cholesterol: 33 mg/dL (ref 0–99)
NonHDL: 46.6
Total CHOL/HDL Ratio: 2
Triglycerides: 66 mg/dL (ref 0.0–149.0)
VLDL: 13.2 mg/dL (ref 0.0–40.0)

## 2022-04-22 LAB — BASIC METABOLIC PANEL
BUN: 34 mg/dL — ABNORMAL HIGH (ref 6–23)
CO2: 28 mEq/L (ref 19–32)
Calcium: 9.9 mg/dL (ref 8.4–10.5)
Chloride: 100 mEq/L (ref 96–112)
Creatinine, Ser: 1.5 mg/dL (ref 0.40–1.50)
GFR: 50.1 mL/min — ABNORMAL LOW (ref >60.00)
Glucose, Bld: 212 mg/dL — ABNORMAL HIGH (ref 70–99)
Potassium: 4.2 mEq/L (ref 3.5–5.1)
Sodium: 138 mEq/L (ref 135–145)

## 2022-04-22 LAB — HEMOGLOBIN A1C: Hgb A1c MFr Bld: 10.2 % — ABNORMAL HIGH (ref 4.6–6.5)

## 2022-04-22 LAB — MICROALBUMIN / CREATININE URINE RATIO
Creatinine,U: 146.8 mg/dL
Microalb Creat Ratio: 3 mg/g (ref 0.0–30.0)
Microalb, Ur: 4.4 mg/dL — ABNORMAL HIGH (ref 0.0–1.9)

## 2022-04-22 MED ORDER — PIOGLITAZONE HCL 30 MG PO TABS: 30.0000 mg | ORAL_TABLET | Freq: Every day | ORAL | 3 refills | Status: AC

## 2022-04-22 MED ORDER — SPIRONOLACTONE 25 MG PO TABS: 25.0000 mg | ORAL_TABLET | Freq: Every day | ORAL | 3 refills | Status: DC

## 2022-04-22 MED ORDER — METFORMIN HCL 1000 MG PO TABS: 1000.0000 mg | ORAL_TABLET | Freq: Two times a day (BID) | ORAL | 3 refills | Status: DC

## 2022-04-22 MED ORDER — GLIPIZIDE 5 MG PO TABS: 5.0000 mg | ORAL_TABLET | Freq: Every day | ORAL | 3 refills | Status: AC

## 2022-04-22 NOTE — Addendum Note (Signed)
Addended by: Loyola Mast on: 04/22/2022 06:53 PM   Modules accepted: Orders

## 2022-04-22 NOTE — Progress Notes (Signed)
[facility] [address] [place] [address] Dept: [phone] Dept Fax: [phone]  Chronic Care Office Visit  Subjective:    Patient ID: Danny Ferguson, male    DOB: 13-Nov-1960, 61 y.o..   MRN: [id number]  Chief Complaint  Patient presents with   Annual Exam    CPE/labs.  Fasting today. No concerns.  Wants flu shot today.     History of Present Illness:  Patient is in today for reassessment of chronic medical issues.  Mr. Fecteau has a history of resistant hypertension. He is managed on lisinopril 40 mg daily, chlorthalidone 25 mg daily, and spironolactone 25 mg daily.    Mr. Demartin has a history of Type 2 diabetes. He is prescribed metformin 1,000 mg bid and glipizide 2.5 mg bid. 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  Mr. Greenhaw does share that his wife died suddenly in January 23, 2023, apparently secondary to pulmonary emboli. He feels he has come to terms with this and is managing. He has found a counselor through a work-site wellness program that has been helpful for him.  Review of Systems  Constitutional: Negative.   HENT: Negative.    Eyes: Negative.   Respiratory:  Negative for cough, shortness of breath and wheezing.   Cardiovascular:  Negative for chest pain, palpitations and leg swelling.  Gastrointestinal:  Negative for abdominal pain, constipation, diarrhea, heartburn, nausea and vomiting.  Musculoskeletal:  Positive for back pain. Negative for myalgias.       Mild low back pain with sciatica which flares at times.  Skin: Negative.   Neurological: Negative.   Psychiatric/Behavioral:  Negative for depression. The patient is not nervous/anxious.    Past Medical History: Patient Active Problem List   Diagnosis Date Noted   Benign localized prostatic hyperplasia with lower urinary tract symptoms (LUTS)  04/13/2021   Benign microscopic hematuria 04/13/2021   Elevated PSA, less than 10 ng/ml 05/15/2017   Diabetes mellitus type 2, uncomplicated (HCC) 04/25/2017   Resistant hypertension 04/25/2017   Past Surgical History:  Procedure Laterality Date   NO PAST SURGERIES     Family History  Problem Relation Age of Onset   Cancer Mother        Ovarian   Diabetes Mother    Hypertension Mother    Fibromyalgia Mother    Hypertension Father    Cancer Father        Prostate   Hyperlipidemia Father    Cancer Sister        breast cancer (has 2 sisters with Factor V Def; unsure which ones)   Mental illness Son 46       Bipolar   Depression Son    Multiple sclerosis Son    Heart disease Maternal Uncle    Cancer Maternal Grandmother        Breast   Diabetes Maternal Grandmother    Hypertension Maternal Grandmother    Factor V Leiden deficiency Maternal Grandmother    Alzheimer's disease Maternal Grandfather    Alzheimer's disease Paternal Grandmother    Hypertension Paternal Grandmother    Cancer Paternal Grandfather        Bone & Prostate   Diabetes Paternal Grandfather    Hyperlipidemia Paternal Grandfather    Hypertension Paternal Grandfather    Glaucoma Paternal Grandfather    Outpatient Medications Prior to Visit  Medication Sig Dispense Refill  aspirin EC 81 MG tablet Take 1 tablet (81 mg total) by mouth daily. Swallow whole. 90 tablet 3   cetirizine (ZYRTEC) 10 MG tablet Take 10 mg by mouth daily.     chlorthalidone (HYGROTON) 25 MG tablet Take 1 tablet (25 mg total) by mouth daily. 90 tablet 3   lisinopril (ZESTRIL) 40 MG tablet Take 1 tablet (40 mg total) by mouth daily. 90 tablet 3   rosuvastatin (CRESTOR) 5 MG tablet Take 1 tablet (5 mg total) by mouth daily. 90 tablet 3   glipiZIDE (GLUCOTROL) 5 MG tablet Take 1 tablet (5 mg total) by mouth daily before breakfast. Needs appointment for further refills. 60 tablet 0   metFORMIN (GLUCOPHAGE) 1000 MG tablet Take 1 tablet  (1,000 mg total) by mouth 2 (two) times daily with a meal. Needs appointment for further refills. 60 tablet 0   spironolactone (ALDACTONE) 25 MG tablet TAKE 1 TABLET BY MOUTH ONCE DAILY . APPOINTMENT REQUIRED FOR FUTURE REFILLS 90 tablet 0   No facility-administered medications prior to visit.    Allergies  Allergen Reactions   Amlodipine Other (See Comments)    Pedal edema   Penicillins Nausea And Vomiting    Objective:   Today's Vitals   04/22/22 0944  BP: 124/76  Pulse: 73  Temp: 97.6 F (36.4 C)  TempSrc: Temporal  SpO2: 96%  Weight: 191 lb 3.2 oz (86.7 kg)  Height: 5' 10.5" (1.791 m)   Body mass index is 27.05 kg/m.   General: Well developed, well nourished. No acute distress. HEENT: Normocephalic, non-traumatic. External ears normal. EAC and TMs normal   bilaterally. PERRL, EOMI. Conjunctiva clear. Fundiscopic exam shows normal disc and   vasculature. Nose clear without congestion or rhinorrhea. Mucous membranes moist.   Oropharynx clear. Good dentition. Neck: Supple. No lymphadenopathy. No thyromegaly. Lungs: Clear to auscultation bilaterally. No wheezing, rales or rhonchi. CV: RRR without murmurs or rubs. Pulses 2+ bilaterally. Abdomen: Soft, non-tender. Bowel sounds positive, normal pitch and frequency. No   hepatosplenomegaly. No rebound or guarding. Extremities: Full ROM. No joint swelling or tenderness. No edema noted. Feet- Skin intact. No sign of maceration between toes. Nails are normal. Dorsalis pedis and   posterior tibial artery pulses are normal. 5.07 monofilament testing normal. Skin: Warm and dry. No rashes. Psych: Alert and oriented. Normal mood and affect.  Health Maintenance Due  Topic Date Due   OPHTHALMOLOGY EXAM  02/22/2022   Diabetic kidney evaluation - GFR measurement  03/26/2022   Diabetic kidney evaluation - Urine ACR  03/26/2022   HEMOGLOBIN A1C  03/27/2022   Lab Results Last hemoglobin A1c Lab Results  Component Value Date    HGBA1C 11.2 (H) 09/24/2021     Assessment & Plan:   1. Annual physical exam Overall health is good. Despite the sudden, unexpected death of his wife, he does appear to be managing his grief well. We discussed recommended health screenings.  2. Type 2 diabetes mellitus without complication, without long-term current use of insulin [facility]) Mr. Culley' diabetes has been in poor control. We will see if his A1c has improved from the Spring. Foot exam completed today. I will check annual DM lbas.  - glipiZIDE (GLUCOTROL) 5 MG tablet; Take 1 tablet (5 mg total) by mouth daily before breakfast.  Dispense: 90 tablet; Refill: 3 - metFORMIN (GLUCOPHAGE) 1000 MG tablet; Take 1 tablet (1,000 mg total) by mouth 2 (two) times daily with a meal.  Dispense: 180 tablet; Refill: 3 - Lipid panel - Microalbumin /  creatinine urine ratio - Basic metabolic panel - Hemoglobin A1c - Urinalysis, Routine w reflex microscopic  3. Essential hypertension Blood pressure is now at goal. Continue chlorthalidone, lisinopril, and spironolactone.  - spironolactone (ALDACTONE) 25 MG tablet; Take 1 tablet (25 mg total) by mouth daily.  Dispense: 90 tablet; Refill: 3  4. Elevated PSA, less than 10 ng/ml Notes he is scheduled for an MRI scan to assess rising PSA level.  5. Need for influenza vaccination  - Flu Vaccine QUAD 6+ mos PF IM (Fluarix Quad PF)   Return in about 3 months (around 07/22/2022) for Reassessment.   Loyola Mast, MD

## 2022-04-27 ENCOUNTER — Other Ambulatory Visit: Payer: Self-pay | Admitting: Urology

## 2022-04-27 DIAGNOSIS — R972 Elevated prostate specific antigen [PSA]: Secondary | ICD-10-CM

## 2022-04-27 DIAGNOSIS — Z8042 Family history of malignant neoplasm of prostate: Secondary | ICD-10-CM

## 2022-05-04 ENCOUNTER — Encounter: Payer: Self-pay | Admitting: Family Medicine

## 2022-05-20 ENCOUNTER — Ambulatory Visit
Admission: RE | Admit: 2022-05-20 | Discharge: 2022-05-20 | Disposition: A | Discharge status: Home / Self Care | Payer: BC Managed Care – PPO | Source: Ambulatory Visit | Attending: Urology | Admitting: Urology

## 2022-05-20 DIAGNOSIS — R972 Elevated prostate specific antigen [PSA]: Secondary | ICD-10-CM

## 2022-05-20 DIAGNOSIS — Z8042 Family history of malignant neoplasm of prostate: Secondary | ICD-10-CM

## 2022-05-20 MED ORDER — GADOPICLENOL 0.5 MMOL/ML IV SOLN
8.0000 mL | Freq: Once | INTRAVENOUS | Status: AC | PRN
  Administered 2022-05-20: 8 mL via INTRAVENOUS

## 2022-06-03 DIAGNOSIS — N401 Enlarged prostate with lower urinary tract symptoms: Secondary | ICD-10-CM | POA: Diagnosis not present

## 2022-06-03 DIAGNOSIS — R972 Elevated prostate specific antigen [PSA]: Secondary | ICD-10-CM | POA: Diagnosis not present

## 2022-06-03 DIAGNOSIS — R3912 Poor urinary stream: Secondary | ICD-10-CM | POA: Diagnosis not present

## 2022-07-22 ENCOUNTER — Ambulatory Visit: Payer: BC Managed Care – PPO | Admitting: Family Medicine

## 2022-11-12 ENCOUNTER — Other Ambulatory Visit: Payer: Self-pay | Admitting: Family Medicine

## 2022-11-12 DIAGNOSIS — I10 Essential (primary) hypertension: Secondary | ICD-10-CM

## 2023-06-05 ENCOUNTER — Other Ambulatory Visit: Payer: Self-pay | Admitting: Family Medicine

## 2023-06-05 DIAGNOSIS — I10 Essential (primary) hypertension: Secondary | ICD-10-CM

## 2023-06-05 DIAGNOSIS — E119 Type 2 diabetes mellitus without complications: Secondary | ICD-10-CM

## 2023-06-05 MED ORDER — CHLORTHALIDONE 25 MG PO TABS: 25.0000 mg | ORAL_TABLET | Freq: Every day | ORAL | 0 refills | Status: AC

## 2023-06-05 MED ORDER — METFORMIN HCL 1000 MG PO TABS: 1000.0000 mg | ORAL_TABLET | Freq: Two times a day (BID) | ORAL | 0 refills | Status: AC

## 2023-06-05 MED ORDER — SPIRONOLACTONE 25 MG PO TABS: 25.0000 mg | ORAL_TABLET | Freq: Every day | ORAL | 0 refills | Status: AC

## 2023-06-05 MED ORDER — ROSUVASTATIN CALCIUM 5 MG PO TABS: 5.0000 mg | ORAL_TABLET | Freq: Every day | ORAL | 0 refills | Status: AC

## 2023-06-05 MED ORDER — LISINOPRIL 40 MG PO TABS: 40.0000 mg | ORAL_TABLET | Freq: Every day | ORAL | 0 refills | Status: AC

## 2023-06-05 NOTE — Telephone Encounter (Signed)
 Copied from CRM [id number]. Topic: Clinical - Medication Refill >> Jun 05, 2023  8:19 AM Kara C wrote: Most Recent Primary Care Visit:  Provider: THEDORA GARNETTE HERO  Department: [facility]  Visit Type: OFFICE VISIT  Date: 04/22/2022  Medication: lisinopril  (ZESTRIL ) 40 MG tablet metFORMIN  (GLUCOPHAGE ) 1000 MG tablet  spironolactone  (ALDACTONE ) 25 MG tablet rosuvastatin  (CRESTOR ) 5 MG tablet chlorthalidone  (HYGROTON ) 25 MG tablet  Has the patient contacted their pharmacy? No (Agent: If no, request that the patient contact the pharmacy for the refill. If patient does not wish to contact the pharmacy document the reason why and proceed with request.) (Agent: If yes, when and what did the pharmacy advise?)  Is this the correct pharmacy for this prescription? Yes If no, delete pharmacy and type the correct one.  This is the patient's preferred pharmacy:   [facility] [address], [place] - [address] [place] [address] Phone: [phone] Fax: [phone]   Has the prescription been filled recently? No  Is the patient out of the medication? Yes  Has the patient been seen for an appointment in the last year OR does the patient have an upcoming appointment?   Can we respond through MyChart?   Agent: Please be advised that Rx refills may take up to 3 business days. We ask that you follow-up with your pharmacy.

## 2023-06-06 NOTE — Telephone Encounter (Signed)
 [facility] from [place] is wanting to clarify pt taking lisinopril  (ZESTRIL ) 40 MG tablet [[id number]] and spironolactone  (ALDACTONE ) 25 MG tablet [[id number]]. He feels this might cause cardia issues. [facility] [address], [place] - [address] [place] [address] Phone: [phone]  Fax: [phone]

## 2023-06-06 NOTE — Telephone Encounter (Signed)
 Called and spoke to pharmacist, advised per provider that it is okay to be on all medications.  We are just continuing medicatios that he has been on. Dm/cma

## 2023-07-28 DIAGNOSIS — R972 Elevated prostate specific antigen [PSA]: Secondary | ICD-10-CM | POA: Diagnosis not present

## 2023-08-11 DIAGNOSIS — N401 Enlarged prostate with lower urinary tract symptoms: Secondary | ICD-10-CM | POA: Diagnosis not present

## 2023-08-11 DIAGNOSIS — R3912 Poor urinary stream: Secondary | ICD-10-CM | POA: Diagnosis not present

## 2023-08-11 DIAGNOSIS — R972 Elevated prostate specific antigen [PSA]: Secondary | ICD-10-CM | POA: Diagnosis not present

## 2023-08-31 ENCOUNTER — Telehealth: Payer: Self-pay

## 2023-08-31 NOTE — Telephone Encounter (Signed)
 Patient was identified as falling into the [employer] [place] Measure - Diabetes.   Patient was: Left voicemail to schedule with primary care provider.   Last OV  04/22/2022 Last A1c 04/22/2022- 10.2  Dr. Veto Kemps MD   Follow up appointment (around 07/22/2022) overdue

## 2024-01-06 ENCOUNTER — Other Ambulatory Visit: Payer: Self-pay | Admitting: Family Medicine

## 2024-01-06 DIAGNOSIS — I10 Essential (primary) hypertension: Secondary | ICD-10-CM

## 2024-03-13 NOTE — Telephone Encounter (Signed)
 Patient was identified as falling into the [employer] [place] Measure - Diabetes.   Patient was: Attribution and/or data issue.  Validation/Investigation needed.  Explanation:  Pt requesting records be sent to another provider to transfer care.
